# Patient Record
Sex: Female | Born: 1950
Health system: Southern US, Community
[De-identification: ages and names within clinical notes are randomized; demographics above are authoritative.]

## PROBLEM LIST (undated history)

## (undated) DIAGNOSIS — Z87442 Personal history of urinary calculi: Secondary | ICD-10-CM

## (undated) DIAGNOSIS — R7303 Prediabetes: Secondary | ICD-10-CM

## (undated) DIAGNOSIS — T8859XA Other complications of anesthesia, initial encounter: Secondary | ICD-10-CM

## (undated) DIAGNOSIS — D649 Anemia, unspecified: Secondary | ICD-10-CM

## (undated) DIAGNOSIS — I10 Essential (primary) hypertension: Secondary | ICD-10-CM

## (undated) DIAGNOSIS — C801 Malignant (primary) neoplasm, unspecified: Secondary | ICD-10-CM

## (undated) DIAGNOSIS — I779 Disorder of arteries and arterioles, unspecified: Secondary | ICD-10-CM

## (undated) DIAGNOSIS — I700 Atherosclerosis of aorta: Secondary | ICD-10-CM

## (undated) DIAGNOSIS — K219 Gastro-esophageal reflux disease without esophagitis: Secondary | ICD-10-CM

## (undated) HISTORY — DX: Atherosclerosis of aorta: I70.0

## (undated) HISTORY — PX: BUNIONECTOMY: SHX129

## (undated) HISTORY — DX: Disorder of arteries and arterioles, unspecified: I77.9

## (undated) HISTORY — PX: ABDOMINAL HYSTERECTOMY: SHX81

## (undated) HISTORY — DX: Malignant (primary) neoplasm, unspecified: C80.1

## (undated) HISTORY — PX: LITHOTRIPSY: SUR834

## (undated) HISTORY — PX: TUBAL LIGATION: SHX77

## (undated) HISTORY — DX: Essential (primary) hypertension: I10

## (undated) HISTORY — PX: LIPOMA EXCISION: SHX5283

---

## 2001-05-29 DIAGNOSIS — C801 Malignant (primary) neoplasm, unspecified: Secondary | ICD-10-CM

## 2001-05-29 HISTORY — DX: Malignant (primary) neoplasm, unspecified: C80.1

## 2001-05-29 HISTORY — PX: THYROIDECTOMY: SHX17

## 2007-07-31 ENCOUNTER — Encounter: Admission: RE | Admit: 2007-07-31 | Discharge: 2007-07-31 | Payer: Self-pay | Admitting: Family Medicine

## 2007-11-13 ENCOUNTER — Encounter: Admission: RE | Admit: 2007-11-13 | Discharge: 2007-11-13 | Payer: Self-pay | Admitting: Family Medicine

## 2008-04-08 ENCOUNTER — Encounter: Admission: RE | Admit: 2008-04-08 | Discharge: 2008-04-08 | Payer: Self-pay | Admitting: Internal Medicine

## 2008-10-28 ENCOUNTER — Other Ambulatory Visit: Admission: RE | Admit: 2008-10-28 | Discharge: 2008-10-28 | Payer: Self-pay | Admitting: Family Medicine

## 2009-06-02 ENCOUNTER — Encounter: Admission: RE | Admit: 2009-06-02 | Discharge: 2009-06-02 | Payer: Self-pay | Admitting: Internal Medicine

## 2009-07-19 ENCOUNTER — Encounter: Admission: RE | Admit: 2009-07-19 | Discharge: 2009-07-19 | Payer: Self-pay | Admitting: *Deleted

## 2010-05-02 ENCOUNTER — Encounter: Admission: RE | Admit: 2010-05-02 | Discharge: 2010-05-02 | Payer: Self-pay | Admitting: Internal Medicine

## 2012-06-24 ENCOUNTER — Other Ambulatory Visit: Payer: Self-pay | Admitting: Internal Medicine

## 2012-06-24 DIAGNOSIS — Z1231 Encounter for screening mammogram for malignant neoplasm of breast: Secondary | ICD-10-CM

## 2012-06-28 ENCOUNTER — Ambulatory Visit
Admission: RE | Admit: 2012-06-28 | Discharge: 2012-06-28 | Disposition: A | Discharge status: Home / Self Care | Payer: 59 | Source: Ambulatory Visit | Attending: Internal Medicine | Admitting: Internal Medicine

## 2012-06-28 DIAGNOSIS — Z1231 Encounter for screening mammogram for malignant neoplasm of breast: Secondary | ICD-10-CM

## 2013-08-21 ENCOUNTER — Other Ambulatory Visit: Payer: Self-pay

## 2013-08-21 DIAGNOSIS — Z1231 Encounter for screening mammogram for malignant neoplasm of breast: Secondary | ICD-10-CM

## 2013-09-01 ENCOUNTER — Ambulatory Visit
Admission: RE | Admit: 2013-09-01 | Discharge: 2013-09-01 | Disposition: A | Discharge status: Home / Self Care | Payer: 59 | Source: Ambulatory Visit

## 2013-09-01 DIAGNOSIS — Z1231 Encounter for screening mammogram for malignant neoplasm of breast: Secondary | ICD-10-CM

## 2014-04-07 ENCOUNTER — Other Ambulatory Visit (HOSPITAL_COMMUNITY)
Admission: RE | Admit: 2014-04-07 | Discharge: 2014-04-07 | Disposition: A | Discharge status: Home / Self Care | Payer: 59 | Source: Ambulatory Visit | Attending: Internal Medicine | Admitting: Internal Medicine

## 2014-04-07 DIAGNOSIS — Z01419 Encounter for gynecological examination (general) (routine) without abnormal findings: Secondary | ICD-10-CM | POA: Diagnosis not present

## 2014-10-16 ENCOUNTER — Other Ambulatory Visit: Payer: Self-pay

## 2014-10-16 DIAGNOSIS — Z1231 Encounter for screening mammogram for malignant neoplasm of breast: Secondary | ICD-10-CM

## 2014-11-13 ENCOUNTER — Ambulatory Visit: Payer: Self-pay

## 2014-12-10 ENCOUNTER — Ambulatory Visit
Admission: RE | Admit: 2014-12-10 | Discharge: 2014-12-10 | Disposition: A | Discharge status: Home / Self Care | Payer: 59 | Source: Ambulatory Visit

## 2014-12-10 DIAGNOSIS — Z1231 Encounter for screening mammogram for malignant neoplasm of breast: Secondary | ICD-10-CM

## 2016-01-10 ENCOUNTER — Other Ambulatory Visit: Payer: Self-pay | Admitting: Internal Medicine

## 2016-01-10 DIAGNOSIS — Z1231 Encounter for screening mammogram for malignant neoplasm of breast: Secondary | ICD-10-CM

## 2016-01-27 ENCOUNTER — Ambulatory Visit
Admission: RE | Admit: 2016-01-27 | Discharge: 2016-01-27 | Disposition: A | Discharge status: Home / Self Care | Payer: 59 | Source: Ambulatory Visit | Attending: Internal Medicine | Admitting: Internal Medicine

## 2016-01-27 ENCOUNTER — Encounter: Payer: Self-pay | Admitting: Radiology

## 2016-01-27 DIAGNOSIS — Z1231 Encounter for screening mammogram for malignant neoplasm of breast: Secondary | ICD-10-CM

## 2017-02-15 DIAGNOSIS — R7303 Prediabetes: Secondary | ICD-10-CM | POA: Diagnosis not present

## 2017-02-15 DIAGNOSIS — E890 Postprocedural hypothyroidism: Secondary | ICD-10-CM | POA: Diagnosis not present

## 2017-02-15 DIAGNOSIS — Z85850 Personal history of malignant neoplasm of thyroid: Secondary | ICD-10-CM | POA: Diagnosis not present

## 2017-02-20 DIAGNOSIS — E890 Postprocedural hypothyroidism: Secondary | ICD-10-CM | POA: Diagnosis not present

## 2017-02-20 DIAGNOSIS — Z85850 Personal history of malignant neoplasm of thyroid: Secondary | ICD-10-CM | POA: Diagnosis not present

## 2017-02-20 DIAGNOSIS — E611 Iron deficiency: Secondary | ICD-10-CM | POA: Diagnosis not present

## 2017-02-20 DIAGNOSIS — R7303 Prediabetes: Secondary | ICD-10-CM | POA: Diagnosis not present

## 2017-03-05 ENCOUNTER — Other Ambulatory Visit: Payer: Self-pay | Admitting: Internal Medicine

## 2017-03-05 DIAGNOSIS — Z1231 Encounter for screening mammogram for malignant neoplasm of breast: Secondary | ICD-10-CM

## 2017-03-16 ENCOUNTER — Ambulatory Visit: Payer: 59

## 2017-04-03 ENCOUNTER — Ambulatory Visit
Admission: RE | Admit: 2017-04-03 | Discharge: 2017-04-03 | Disposition: A | Discharge status: Home / Self Care | Payer: 59 | Source: Ambulatory Visit | Attending: Internal Medicine | Admitting: Internal Medicine

## 2017-04-03 DIAGNOSIS — Z1231 Encounter for screening mammogram for malignant neoplasm of breast: Secondary | ICD-10-CM

## 2017-05-04 ENCOUNTER — Other Ambulatory Visit (HOSPITAL_COMMUNITY): Payer: Self-pay | Admitting: Surgery

## 2017-05-28 ENCOUNTER — Encounter: Payer: 59 | Attending: Surgery | Admitting: Registered"

## 2017-05-28 ENCOUNTER — Encounter: Payer: Self-pay | Admitting: Registered"

## 2017-05-28 DIAGNOSIS — E890 Postprocedural hypothyroidism: Secondary | ICD-10-CM | POA: Diagnosis not present

## 2017-05-28 DIAGNOSIS — Z880 Allergy status to penicillin: Secondary | ICD-10-CM | POA: Insufficient documentation

## 2017-05-28 DIAGNOSIS — M549 Dorsalgia, unspecified: Secondary | ICD-10-CM | POA: Insufficient documentation

## 2017-05-28 DIAGNOSIS — Z713 Dietary counseling and surveillance: Secondary | ICD-10-CM | POA: Insufficient documentation

## 2017-05-28 DIAGNOSIS — Z8249 Family history of ischemic heart disease and other diseases of the circulatory system: Secondary | ICD-10-CM | POA: Diagnosis not present

## 2017-05-28 DIAGNOSIS — Z8261 Family history of arthritis: Secondary | ICD-10-CM | POA: Insufficient documentation

## 2017-05-28 DIAGNOSIS — Z6841 Body Mass Index (BMI) 40.0 and over, adult: Secondary | ICD-10-CM | POA: Insufficient documentation

## 2017-05-28 DIAGNOSIS — Z85850 Personal history of malignant neoplasm of thyroid: Secondary | ICD-10-CM | POA: Insufficient documentation

## 2017-05-28 DIAGNOSIS — Z882 Allergy status to sulfonamides status: Secondary | ICD-10-CM | POA: Diagnosis not present

## 2017-05-28 DIAGNOSIS — Z87442 Personal history of urinary calculi: Secondary | ICD-10-CM | POA: Diagnosis not present

## 2017-05-28 DIAGNOSIS — Z833 Family history of diabetes mellitus: Secondary | ICD-10-CM | POA: Diagnosis not present

## 2017-05-28 DIAGNOSIS — E669 Obesity, unspecified: Secondary | ICD-10-CM

## 2017-05-28 DIAGNOSIS — I10 Essential (primary) hypertension: Secondary | ICD-10-CM | POA: Insufficient documentation

## 2017-05-28 DIAGNOSIS — E7800 Pure hypercholesterolemia, unspecified: Secondary | ICD-10-CM | POA: Diagnosis not present

## 2017-05-28 NOTE — Progress Notes (Signed)
Pre-Op Assessment Visit:  Pre-Operative Sleeve gastrectomy Surgery  Medical Nutrition Therapy:  Appt start time: 2:00  End time: 2:50  Patient was seen on 05/28/2017 for Pre-Operative Nutrition Assessment. Assessment and letter of approval faxed to [facility] Bariatric Surgery Program coordinator on 05/28/2017.   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 Pt states she wants to know how to eat healthier. Pt states she is currently following Nutrisystem because its easy even though she does not like the food.   Per insurance, pt needs 6 SWL visits prior to surgery.    24 hr Dietary Recall: First Meal: boiled eggs; nutrisystem (english muffin, egg, sausage patty) Snack: sometimes nutrisystem snack bars (nutricurb) Second Meal: chicken noodle soup Snack: cheese stick, nuts Third Meal: sometimes skips; eggs or sushi Snack: none Beverages: water, unsweetened tea, coffee (2 cups daily)  Encouraged to engage in 150 minutes of moderate physical activity including cardiovascular and weight baring weekly  Handouts given during visit include:  . Pre-Op Goals . Bariatric Surgery Protein Shakes . Vitamin and Mineral Recommendations  During the appointment today the following Pre-Op Goals were reviewed with the patient: . Track your food and beverage: MyFitness Pal or Baritastic App . Make healthy food choices . Begin to limit portion sizes . Limited concentrated sugars and fried foods . Keep fat/sugar in the single digits per serving on          food labels . Practice CHEWING your food  (aim for 30 chews per bite or until applesauce consistency) . Practice not drinking 15  minutes before, during, and 30 minutes after each meal/snack . Avoid all carbonated beverages  . Avoid/limit caffeinated beverages  . Avoid all sugar-sweetened beverages . Avoid alcohol . Consume 3 meals per day; eat every 3-5 hours . Make a list of non-food related activities . Aim for 64-100 ounces of FLUID daily  . Aim for at least 60-80 grams of PROTEIN daily . Look for a liquid protein source that contain ?15 g protein and ?5 g carbohydrate  (ex: shakes, drinks, shots) . Physical activity is an important part of a healthy lifestyle so keep it moving!  Follow diet recommendations listed below Energy and Macronutrient Recommendations: Calories: 1600 Carbohydrate: 180 Protein: 120 Fat: 44  Demonstrated degree of understanding via:  Teach Back   Teaching Method Utilized:  Visual Auditory Hands on  Barriers to learning/adherence to lifestyle change: work-life balance  Patient to call the [facility] to enroll in Pre-Op and Post-Op Nutrition Education when surgery date is scheduled.

## 2017-06-01 DIAGNOSIS — H2513 Age-related nuclear cataract, bilateral: Secondary | ICD-10-CM | POA: Diagnosis not present

## 2017-06-01 DIAGNOSIS — H25013 Cortical age-related cataract, bilateral: Secondary | ICD-10-CM | POA: Diagnosis not present

## 2017-06-01 DIAGNOSIS — H3509 Other intraretinal microvascular abnormalities: Secondary | ICD-10-CM | POA: Diagnosis not present

## 2017-06-01 DIAGNOSIS — H04123 Dry eye syndrome of bilateral lacrimal glands: Secondary | ICD-10-CM | POA: Diagnosis not present

## 2017-06-01 DIAGNOSIS — H35033 Hypertensive retinopathy, bilateral: Secondary | ICD-10-CM | POA: Diagnosis not present

## 2017-06-25 ENCOUNTER — Ambulatory Visit: Payer: Self-pay | Admitting: Registered"

## 2017-07-02 ENCOUNTER — Encounter: Payer: Self-pay | Admitting: Registered"

## 2017-07-02 ENCOUNTER — Encounter: Payer: Medicare Other | Attending: Surgery | Admitting: Registered"

## 2017-07-02 DIAGNOSIS — E890 Postprocedural hypothyroidism: Secondary | ICD-10-CM | POA: Diagnosis not present

## 2017-07-02 DIAGNOSIS — M549 Dorsalgia, unspecified: Secondary | ICD-10-CM | POA: Diagnosis not present

## 2017-07-02 DIAGNOSIS — Z85850 Personal history of malignant neoplasm of thyroid: Secondary | ICD-10-CM | POA: Insufficient documentation

## 2017-07-02 DIAGNOSIS — Z8261 Family history of arthritis: Secondary | ICD-10-CM | POA: Insufficient documentation

## 2017-07-02 DIAGNOSIS — Z882 Allergy status to sulfonamides status: Secondary | ICD-10-CM | POA: Diagnosis not present

## 2017-07-02 DIAGNOSIS — Z8249 Family history of ischemic heart disease and other diseases of the circulatory system: Secondary | ICD-10-CM | POA: Diagnosis not present

## 2017-07-02 DIAGNOSIS — E7800 Pure hypercholesterolemia, unspecified: Secondary | ICD-10-CM | POA: Diagnosis not present

## 2017-07-02 DIAGNOSIS — Z880 Allergy status to penicillin: Secondary | ICD-10-CM | POA: Insufficient documentation

## 2017-07-02 DIAGNOSIS — Z87442 Personal history of urinary calculi: Secondary | ICD-10-CM | POA: Diagnosis not present

## 2017-07-02 DIAGNOSIS — Z713 Dietary counseling and surveillance: Secondary | ICD-10-CM | POA: Insufficient documentation

## 2017-07-02 DIAGNOSIS — I10 Essential (primary) hypertension: Secondary | ICD-10-CM | POA: Insufficient documentation

## 2017-07-02 DIAGNOSIS — Z833 Family history of diabetes mellitus: Secondary | ICD-10-CM | POA: Diagnosis not present

## 2017-07-02 DIAGNOSIS — Z6841 Body Mass Index (BMI) 40.0 and over, adult: Secondary | ICD-10-CM | POA: Diagnosis not present

## 2017-07-02 DIAGNOSIS — E669 Obesity, unspecified: Secondary | ICD-10-CM

## 2017-07-02 NOTE — Progress Notes (Signed)
Sleeve Gastrectomy Appt start time: 10:45 end time: 11:10  Assessment: 1st SWL Appointment.   Start Wt at [facility]: 240.6 Wt: 238.1 BMI: 48.09   Pt arrives having lost 2.5 lbs from previous visit. Pt states she has been following keto diet recently. Pt states is tracking using baritastic app and likes the accountability. Pt states she has reduced carbonation, taking MVI + Vtamin D + Fe, doing well with limiting concentrated sugars (eats sugar-free jello), and drinking at least 64 ounces of fluid a day. Pt states chewing is challenging but still working on it. Pt states she eats while working, driving, or watching television. Pt states her cat recently died. Pt is very motivated and making great changes. Pt states she wants to make this tool work for her.    MEDICATIONS: See list   DIETARY INTAKE:  24-hr recall:  B ( AM): eggs, cheese  Snk ( AM): sometimes cheese stick  L ( PM): protein, salad Snk ( PM): none D ( PM): protein, salad, cauliflower Snk ( PM): none Beverages: water, coffee  Usual physical activity: none stated  Diet to Follow: 1600 calories 180 g carbohydrates 120 g protein 44 g fat  Preferred Learning Style:   No preference indicated   Learning Readiness:   Ready  Change in progress     Nutritional Diagnosis:  [place]-3.3 Overweight/obesity related to past poor dietary habits and physical inactivity as evidenced by patient w/ planned sleeve gastrectomy surgery following dietary guidelines for continued weight loss.    Intervention:  Nutrition counseling for upcoming Bariatric Surgery.  Goals:  - Aim for 150 minutes of physical activity including cardio and weight bearing every week - Get back on track with physical activity.  - Continue to aim to chew well. - Work on mindful eating during meal times.   Teaching Method Utilized:  Visual Auditory  Handouts given during visit include:  none  Barriers to learning/adherence to lifestyle change: none  indentified  Demonstrated degree of understanding via:  Teach Back   Monitoring/Evaluation:  Dietary intake, exercise, and body weight in 1 month(s).

## 2017-07-02 NOTE — Patient Instructions (Addendum)
-   Get back on track with physical activity.   - Continue to aim to chew well.  - Work on mindful eating during meal times.

## 2017-07-24 ENCOUNTER — Ambulatory Visit: Payer: Self-pay | Admitting: Registered"

## 2017-07-31 ENCOUNTER — Encounter: Payer: Medicare Other | Attending: Surgery | Admitting: Registered"

## 2017-07-31 ENCOUNTER — Encounter: Payer: Self-pay | Admitting: Registered"

## 2017-07-31 DIAGNOSIS — E890 Postprocedural hypothyroidism: Secondary | ICD-10-CM | POA: Insufficient documentation

## 2017-07-31 DIAGNOSIS — I10 Essential (primary) hypertension: Secondary | ICD-10-CM | POA: Diagnosis not present

## 2017-07-31 DIAGNOSIS — Z713 Dietary counseling and surveillance: Secondary | ICD-10-CM | POA: Insufficient documentation

## 2017-07-31 DIAGNOSIS — Z87442 Personal history of urinary calculi: Secondary | ICD-10-CM | POA: Insufficient documentation

## 2017-07-31 DIAGNOSIS — E7800 Pure hypercholesterolemia, unspecified: Secondary | ICD-10-CM | POA: Insufficient documentation

## 2017-07-31 DIAGNOSIS — E669 Obesity, unspecified: Secondary | ICD-10-CM

## 2017-07-31 DIAGNOSIS — Z85850 Personal history of malignant neoplasm of thyroid: Secondary | ICD-10-CM | POA: Diagnosis not present

## 2017-07-31 DIAGNOSIS — Z8261 Family history of arthritis: Secondary | ICD-10-CM | POA: Diagnosis not present

## 2017-07-31 DIAGNOSIS — Z833 Family history of diabetes mellitus: Secondary | ICD-10-CM | POA: Diagnosis not present

## 2017-07-31 DIAGNOSIS — Z880 Allergy status to penicillin: Secondary | ICD-10-CM | POA: Diagnosis not present

## 2017-07-31 DIAGNOSIS — M549 Dorsalgia, unspecified: Secondary | ICD-10-CM | POA: Insufficient documentation

## 2017-07-31 DIAGNOSIS — Z6841 Body Mass Index (BMI) 40.0 and over, adult: Secondary | ICD-10-CM | POA: Diagnosis not present

## 2017-07-31 DIAGNOSIS — Z882 Allergy status to sulfonamides status: Secondary | ICD-10-CM | POA: Diagnosis not present

## 2017-07-31 DIAGNOSIS — Z8249 Family history of ischemic heart disease and other diseases of the circulatory system: Secondary | ICD-10-CM | POA: Insufficient documentation

## 2017-07-31 NOTE — Progress Notes (Signed)
Sleeve Gastrectomy Appt start time: 8:00 end time: 8:23  Assessment: 2nd SWL Appointment.   Start Wt at [facility]: 240.6 Wt: 238.1 BMI: 48.09   Pt arrives having maintained weight from previous visit. Pt states she has been following keto diet recently. Pt states she is also having surgery to reduce risk of getting diabetes. Pt states she has been more intentional and eats more meals at the table, less in her car and while working. Pt states she has been reducing intake of carbonated water. Pt states is still working on chewing well and forgets sometimes. Pt states her family is coming to visit from [place] in April.   Pt states she has reduced carbonation, taking MVI + Vtamin D + Fe, doing well with limiting concentrated sugars (eats sugar-free jello), and drinking at least 64 ounces of fluid a day. Pt is very motivated and making great changes. Pt states she wants to make this tool work for her.   Per insurance, pt needs 6 SWL visits prior to surgery.  MEDICATIONS: See list   DIETARY INTAKE:  24-hr recall:  B ( AM): eggs, cheese  Snk ( AM): sometimes cheese stick  L ( PM): protein, salad Snk ( PM): none D ( PM): protein, salad, cauliflower Snk ( PM): none Beverages: water, coffee  Usual physical activity: weekends mostly, 30 min 3 days/week on bike  Diet to Follow: 1600 calories 180 g carbohydrates 120 g protein 44 g fat  Preferred Learning Style:   No preference indicated   Learning Readiness:   Ready  Change in progress     Nutritional Diagnosis:  [place]-3.3 Overweight/obesity related to past poor dietary habits and physical inactivity as evidenced by patient w/ planned sleeve gastrectomy surgery following dietary guidelines for continued weight loss.    Intervention:  Nutrition counseling for upcoming Bariatric Surgery.  Goals:  - Find at least 2-3 flavors of protein shake/drink/powder that you enjoy with at least 15 grams or more of protein and 5 grams or less of  carbohydrate.  - Continue to work on chewing. - Increase physical activity to 4 times per week, by adding another day during the work week of biking or walking when weather permits.  - Keep up the great work!   Teaching Method Utilized:  Visual Auditory  Handouts given during visit include:  none  Barriers to learning/adherence to lifestyle change: none indentified  Demonstrated degree of understanding via:  Teach Back   Monitoring/Evaluation:  Dietary intake, exercise, and body weight in 1 month(s).

## 2017-07-31 NOTE — Patient Instructions (Addendum)
-   Find at least 2-3 flavors of protein shake/drink/powder that you enjoy with at least 15 grams or more of protein and 5 grams or less of carbohydrate.   - Continue to work on chewing.  - Increase physical activity to 4 times per week, by adding another day during the work week of biking or walking when weather permits.   - Keep up the great work!

## 2017-08-14 DIAGNOSIS — I10 Essential (primary) hypertension: Secondary | ICD-10-CM | POA: Diagnosis not present

## 2017-08-14 DIAGNOSIS — Z01419 Encounter for gynecological examination (general) (routine) without abnormal findings: Secondary | ICD-10-CM | POA: Diagnosis not present

## 2017-08-14 DIAGNOSIS — Z0000 Encounter for general adult medical examination without abnormal findings: Secondary | ICD-10-CM | POA: Diagnosis not present

## 2017-08-15 DIAGNOSIS — Z1151 Encounter for screening for human papillomavirus (HPV): Secondary | ICD-10-CM | POA: Diagnosis not present

## 2017-08-15 DIAGNOSIS — Z01419 Encounter for gynecological examination (general) (routine) without abnormal findings: Secondary | ICD-10-CM | POA: Diagnosis not present

## 2017-08-21 ENCOUNTER — Ambulatory Visit (INDEPENDENT_AMBULATORY_CARE_PROVIDER_SITE_OTHER): Payer: Medicare Other | Admitting: Psychiatry

## 2017-08-21 ENCOUNTER — Ambulatory Visit: Payer: Self-pay | Admitting: Registered"

## 2017-08-21 DIAGNOSIS — F509 Eating disorder, unspecified: Secondary | ICD-10-CM | POA: Diagnosis not present

## 2017-08-31 ENCOUNTER — Encounter: Payer: Self-pay | Admitting: Registered"

## 2017-08-31 ENCOUNTER — Encounter: Payer: Medicare Other | Attending: Surgery | Admitting: Registered"

## 2017-08-31 DIAGNOSIS — Z880 Allergy status to penicillin: Secondary | ICD-10-CM | POA: Insufficient documentation

## 2017-08-31 DIAGNOSIS — Z8249 Family history of ischemic heart disease and other diseases of the circulatory system: Secondary | ICD-10-CM | POA: Diagnosis not present

## 2017-08-31 DIAGNOSIS — Z8261 Family history of arthritis: Secondary | ICD-10-CM | POA: Insufficient documentation

## 2017-08-31 DIAGNOSIS — Z6841 Body Mass Index (BMI) 40.0 and over, adult: Secondary | ICD-10-CM | POA: Diagnosis not present

## 2017-08-31 DIAGNOSIS — E7800 Pure hypercholesterolemia, unspecified: Secondary | ICD-10-CM | POA: Insufficient documentation

## 2017-08-31 DIAGNOSIS — M549 Dorsalgia, unspecified: Secondary | ICD-10-CM | POA: Insufficient documentation

## 2017-08-31 DIAGNOSIS — I10 Essential (primary) hypertension: Secondary | ICD-10-CM | POA: Insufficient documentation

## 2017-08-31 DIAGNOSIS — E669 Obesity, unspecified: Secondary | ICD-10-CM

## 2017-08-31 DIAGNOSIS — Z87442 Personal history of urinary calculi: Secondary | ICD-10-CM | POA: Diagnosis not present

## 2017-08-31 DIAGNOSIS — Z882 Allergy status to sulfonamides status: Secondary | ICD-10-CM | POA: Diagnosis not present

## 2017-08-31 DIAGNOSIS — Z85850 Personal history of malignant neoplasm of thyroid: Secondary | ICD-10-CM | POA: Insufficient documentation

## 2017-08-31 DIAGNOSIS — Z833 Family history of diabetes mellitus: Secondary | ICD-10-CM | POA: Diagnosis not present

## 2017-08-31 DIAGNOSIS — E890 Postprocedural hypothyroidism: Secondary | ICD-10-CM | POA: Insufficient documentation

## 2017-08-31 DIAGNOSIS — Z713 Dietary counseling and surveillance: Secondary | ICD-10-CM | POA: Diagnosis not present

## 2017-08-31 NOTE — Patient Instructions (Addendum)
-   Increase physical activity to 4 times per week, by adding another day during the work week of biking or walking when weather permits.   - Find at least 1 more flavor of protein shake/drink/powder that you enjoy with at least 15 grams or more of protein and 5 grams or less of carbohydrate.   - Aim to stop and take breaks while working.

## 2017-08-31 NOTE — Progress Notes (Signed)
Sleeve Gastrectomy Appt start time: 8:00 end time: 9:20  Assessment: 3rd SWL Appointment.   Start Wt at [facility]: 240.6 Wt: 235.9 BMI: 47.65   Pt arrives having lost 2.2 lbs  from previous visit. Pt states she has been following keto diet. Pt states she likes Premier and Atkins chocolate flavors; vanilla is ok. Pt reports chewing pretty good especially when dining alone; more challenging when eating out with friends. Pt states she is preparing meals in advance and taking time to eat at the table. Pt states she feels like she has control. Pt states she has not been able to add another workout day into her routine. Pt states she sometimes does not take the time to stop working in order to eat lunch.   Pt states she is also having surgery to reduce risk of getting diabetes. Pt states she has been reducing intake of carbonated water. Pt states is still working on chewing well and forgets sometimes. Pt states her family is coming to visit from [place] in April.   Pt states she has reduced carbonation, taking MVI + Vtamin D + Fe, doing well with limiting concentrated sugars (eats sugar-free jello), and drinking at least 64 ounces of fluid a day. Pt is very motivated and making great changes. Pt states she wants to make this tool work for her.   Per insurance, pt needs 6 SWL visits prior to surgery.  MEDICATIONS: See list   DIETARY INTAKE:  24-hr recall:  B ( AM): eggs, cheese  Snk ( AM): sometimes cheese stick  L ( PM): protein, salad or mozzarella stick Snk ( PM): none D ( PM): protein, salad, cauliflower Snk ( PM): none Beverages: water, coffee  Usual physical activity: weekends mostly, 30 min 3 days/week on bike  Diet to Follow: 1600 calories 180 g carbohydrates 120 g protein 44 g fat  Preferred Learning Style:   No preference indicated   Learning Readiness:   Ready  Change in progress     Nutritional Diagnosis:  [place]-3.3 Overweight/obesity related to past poor dietary  habits and physical inactivity as evidenced by patient w/ planned sleeve gastrectomy surgery following dietary guidelines for continued weight loss.    Intervention:  Nutrition counseling for upcoming Bariatric Surgery.  Goals:  - Increase physical activity to 4 times per week, by adding another day during the work week of biking or walking when weather permits.  - Find at least 1 more flavor of protein shake/drink/powder that you enjoy with at least 15 grams or more of protein and 5 grams or less of carbohydrate.  - Aim to stop and take breaks while working.   Teaching Method Utilized:  Visual Auditory  Handouts given during visit include:  none  Barriers to learning/adherence to lifestyle change: none indentified  Demonstrated degree of understanding via:  Teach Back   Monitoring/Evaluation:  Dietary intake, exercise, and body weight in 1 month(s).

## 2017-09-18 ENCOUNTER — Ambulatory Visit: Payer: Self-pay | Admitting: Registered"

## 2017-09-28 ENCOUNTER — Encounter: Payer: Self-pay | Admitting: Registered"

## 2017-09-28 ENCOUNTER — Encounter: Payer: Medicare Other | Attending: Surgery | Admitting: Registered"

## 2017-09-28 DIAGNOSIS — M549 Dorsalgia, unspecified: Secondary | ICD-10-CM | POA: Diagnosis not present

## 2017-09-28 DIAGNOSIS — Z85850 Personal history of malignant neoplasm of thyroid: Secondary | ICD-10-CM | POA: Insufficient documentation

## 2017-09-28 DIAGNOSIS — E890 Postprocedural hypothyroidism: Secondary | ICD-10-CM | POA: Diagnosis not present

## 2017-09-28 DIAGNOSIS — I10 Essential (primary) hypertension: Secondary | ICD-10-CM | POA: Insufficient documentation

## 2017-09-28 DIAGNOSIS — E669 Obesity, unspecified: Secondary | ICD-10-CM

## 2017-09-28 DIAGNOSIS — E7800 Pure hypercholesterolemia, unspecified: Secondary | ICD-10-CM | POA: Insufficient documentation

## 2017-09-28 DIAGNOSIS — Z6841 Body Mass Index (BMI) 40.0 and over, adult: Secondary | ICD-10-CM | POA: Diagnosis not present

## 2017-09-28 DIAGNOSIS — Z880 Allergy status to penicillin: Secondary | ICD-10-CM | POA: Insufficient documentation

## 2017-09-28 DIAGNOSIS — Z833 Family history of diabetes mellitus: Secondary | ICD-10-CM | POA: Diagnosis not present

## 2017-09-28 DIAGNOSIS — Z8261 Family history of arthritis: Secondary | ICD-10-CM | POA: Diagnosis not present

## 2017-09-28 DIAGNOSIS — Z713 Dietary counseling and surveillance: Secondary | ICD-10-CM | POA: Diagnosis not present

## 2017-09-28 DIAGNOSIS — Z882 Allergy status to sulfonamides status: Secondary | ICD-10-CM | POA: Insufficient documentation

## 2017-09-28 DIAGNOSIS — Z8249 Family history of ischemic heart disease and other diseases of the circulatory system: Secondary | ICD-10-CM | POA: Insufficient documentation

## 2017-09-28 DIAGNOSIS — Z87442 Personal history of urinary calculi: Secondary | ICD-10-CM | POA: Insufficient documentation

## 2017-09-28 NOTE — Progress Notes (Signed)
Sleeve Gastrectomy Appt start time: 8:00 end time: 8:24  Assessment: 4th SWL Appointment.   Start Wt at [facility]: 240.6 Wt: 234.1 BMI: 47.28   Pt arrives having maintained weight from previous visit.  Pt states she likes Premier and Atkins chocolate flavors; vanilla, peaches and cream, and chocolate. Pt states she has been listening to her body. Pt states work has been really busy and stressful, thinking about getting another job. Pt states she has not been able to exercise within the past month.   Pt reports chewing pretty good especially when dining alone; more challenging when eating out with friends. Pt states she is preparing meals in advance and taking time to eat at the table. Pt states she feels like she has control. Pt states she sometimes does not take the time to stop working in order to eat lunch.   Pt states she is also having surgery to reduce risk of getting diabetes. Pt states she has been reducing intake of carbonated water. Pt states is still working on chewing well and forgets sometimes. Pt states her family is coming to visit from [place] in April.   Pt states she has reduced carbonation, taking MVI + Vtamin D + Fe, doing well with limiting concentrated sugars (eats sugar-free jello), and drinking at least 64 ounces of fluid a day. Pt is very motivated and making great changes. Pt states she wants to make this tool work for her.   Per insurance, pt needs 6 SWL visits prior to surgery.  MEDICATIONS: See list   DIETARY INTAKE:  24-hr recall:  B ( AM): eggs, cheese  Snk ( AM): sometimes cheese stick  L ( PM): protein shake Snk ( PM): none D ( PM): protein, salad, cauliflower Snk ( PM): none Beverages: water, coffee  Usual physical activity: none stated, too busy with work  Diet to Follow: 1600 calories 180 g carbohydrates 120 g protein 44 g fat  Preferred Learning Style:   No preference indicated   Learning Readiness:   Ready  Change in  progress     Nutritional Diagnosis:  [place]-3.3 Overweight/obesity related to past poor dietary habits and physical inactivity as evidenced by patient w/ planned sleeve gastrectomy surgery following dietary guidelines for continued weight loss.    Intervention:  Nutrition counseling for upcoming Bariatric Surgery.  Goals:  - Aim to take breaks during workday.  - Increase physical activity to 4 times per week, by adding another day during the work week of biking or walking when weather permits.  - Look into support group.  Teaching Method Utilized:  Visual Auditory  Handouts given during visit include:  none  Barriers to learning/adherence to lifestyle change: none indentified  Demonstrated degree of understanding via:  Teach Back   Monitoring/Evaluation:  Dietary intake, exercise, and body weight in 1 month(s).

## 2017-09-28 NOTE — Patient Instructions (Addendum)
-   Aim to take breaks during workday.   - Increase physical activity to 4 times per week, by adding another day during the work week of biking or walking when weather permits.   - Look into support group.

## 2017-10-12 DIAGNOSIS — I10 Essential (primary) hypertension: Secondary | ICD-10-CM | POA: Diagnosis not present

## 2017-10-12 DIAGNOSIS — E8881 Metabolic syndrome: Secondary | ICD-10-CM | POA: Diagnosis not present

## 2017-10-12 DIAGNOSIS — E039 Hypothyroidism, unspecified: Secondary | ICD-10-CM | POA: Diagnosis not present

## 2017-10-12 DIAGNOSIS — E7800 Pure hypercholesterolemia, unspecified: Secondary | ICD-10-CM | POA: Diagnosis not present

## 2017-10-16 ENCOUNTER — Ambulatory Visit: Payer: Self-pay | Admitting: Registered"

## 2017-11-01 ENCOUNTER — Encounter: Payer: Medicare Other | Attending: Surgery | Admitting: Registered"

## 2017-11-01 ENCOUNTER — Encounter: Payer: Self-pay | Admitting: Registered"

## 2017-11-01 DIAGNOSIS — Z87442 Personal history of urinary calculi: Secondary | ICD-10-CM | POA: Insufficient documentation

## 2017-11-01 DIAGNOSIS — M549 Dorsalgia, unspecified: Secondary | ICD-10-CM | POA: Insufficient documentation

## 2017-11-01 DIAGNOSIS — Z8249 Family history of ischemic heart disease and other diseases of the circulatory system: Secondary | ICD-10-CM | POA: Insufficient documentation

## 2017-11-01 DIAGNOSIS — E7800 Pure hypercholesterolemia, unspecified: Secondary | ICD-10-CM | POA: Diagnosis not present

## 2017-11-01 DIAGNOSIS — I10 Essential (primary) hypertension: Secondary | ICD-10-CM | POA: Diagnosis not present

## 2017-11-01 DIAGNOSIS — Z880 Allergy status to penicillin: Secondary | ICD-10-CM | POA: Diagnosis not present

## 2017-11-01 DIAGNOSIS — Z882 Allergy status to sulfonamides status: Secondary | ICD-10-CM | POA: Diagnosis not present

## 2017-11-01 DIAGNOSIS — E890 Postprocedural hypothyroidism: Secondary | ICD-10-CM | POA: Diagnosis not present

## 2017-11-01 DIAGNOSIS — Z85850 Personal history of malignant neoplasm of thyroid: Secondary | ICD-10-CM | POA: Insufficient documentation

## 2017-11-01 DIAGNOSIS — Z713 Dietary counseling and surveillance: Secondary | ICD-10-CM | POA: Insufficient documentation

## 2017-11-01 DIAGNOSIS — Z6841 Body Mass Index (BMI) 40.0 and over, adult: Secondary | ICD-10-CM | POA: Diagnosis not present

## 2017-11-01 DIAGNOSIS — Z8261 Family history of arthritis: Secondary | ICD-10-CM | POA: Diagnosis not present

## 2017-11-01 DIAGNOSIS — E669 Obesity, unspecified: Secondary | ICD-10-CM

## 2017-11-01 DIAGNOSIS — Z833 Family history of diabetes mellitus: Secondary | ICD-10-CM | POA: Diagnosis not present

## 2017-11-01 NOTE — Progress Notes (Signed)
Sleeve Gastrectomy Appt start time: 8:05 end time: 8:20  Assessment: 5th SWL Appointment.   Start Wt at [facility]: 240.6 Wt: 239.9 BMI: 48.45   Pt arrives having gained 5.8 lbs from previous visit. Pt states she is still stressed with work. Pt states she has not been stress eating. Pt states she has been not been drinking as much as she should. Pt states she will be in [place] for 10 days in July. Pt states she she is trying to stay at her job until Feb and plans to transition after that; wants to receive bonus first. Pt states she's been biking at home 30 min, 4 days/week.   Pt states she likes Premier and Atkins chocolate flavors; vanilla, peaches and cream, and chocolate. Pt states she has been listening to her body. Pt states work has been really busy and stressful, thinking about getting another job. Pt states she has not been able to exercise within the past month.   Pt reports chewing pretty good especially when dining alone; more challenging when eating out with friends. Pt states she is preparing meals in advance and taking time to eat at the table. Pt states she feels like she has control. Pt states she sometimes does not take the time to stop working in order to eat lunch.   Pt states she is also having surgery to reduce risk of getting diabetes. Pt states she has been reducing intake of carbonated water. Pt states is still working on chewing well and forgets sometimes. Pt states her family is coming to visit from [place] in April.   Pt states she has reduced carbonation, taking MVI + Vtamin D + Fe, doing well with limiting concentrated sugars (eats sugar-free jello), and drinking at least 64 ounces of fluid a day. Pt is very motivated and making great changes. Pt states she wants to make this tool work for her.   Per insurance, pt needs 6 SWL visits prior to surgery.  MEDICATIONS: See list   DIETARY INTAKE:  24-hr recall:  B ( AM): eggs, cheese  Snk ( AM): sometimes cheese stick   L ( PM): protein shake Snk ( PM): none D ( PM): protein, salad, cauliflower Snk ( PM): none Beverages: water, coffee  Usual physical activity: stationary biking 30 min, 4 days/week  Diet to Follow: 1600 calories 180 g carbohydrates 120 g protein 44 g fat  Preferred Learning Style:   No preference indicated   Learning Readiness:   Ready  Change in progress     Nutritional Diagnosis:  [place]-3.3 Overweight/obesity related to past poor dietary habits and physical inactivity as evidenced by patient w/ planned sleeve gastrectomy surgery following dietary guidelines for continued weight loss.    Intervention:  Nutrition counseling for upcoming Bariatric Surgery.  Goals:  - Aim for 150 minutes of physical activity including cardio and weight bearing every week - Look into support group. - Remember to sip throughout the day.  - Continue to keep up the great work with habits already established.   Teaching Method Utilized:  Visual Auditory  Handouts given during visit include:  none  Barriers to learning/adherence to lifestyle change: none indentified  Demonstrated degree of understanding via:  Teach Back   Monitoring/Evaluation:  Dietary intake, exercise, and body weight in 1 month(s).

## 2017-11-01 NOTE — Patient Instructions (Addendum)
-   Look into support group.  - Remember to sip throughout the day.   - Continue to keep up the great work with habits already established.

## 2017-11-13 ENCOUNTER — Ambulatory Visit: Payer: Self-pay | Admitting: Registered"

## 2017-11-20 DIAGNOSIS — D509 Iron deficiency anemia, unspecified: Secondary | ICD-10-CM | POA: Diagnosis not present

## 2017-11-27 ENCOUNTER — Encounter: Payer: Self-pay | Admitting: Registered"

## 2017-11-27 ENCOUNTER — Encounter: Payer: Medicare Other | Attending: Surgery | Admitting: Registered"

## 2017-11-27 DIAGNOSIS — E890 Postprocedural hypothyroidism: Secondary | ICD-10-CM | POA: Insufficient documentation

## 2017-11-27 DIAGNOSIS — E669 Obesity, unspecified: Secondary | ICD-10-CM

## 2017-11-27 DIAGNOSIS — I10 Essential (primary) hypertension: Secondary | ICD-10-CM | POA: Diagnosis not present

## 2017-11-27 DIAGNOSIS — F419 Anxiety disorder, unspecified: Secondary | ICD-10-CM | POA: Diagnosis not present

## 2017-11-27 DIAGNOSIS — Z8249 Family history of ischemic heart disease and other diseases of the circulatory system: Secondary | ICD-10-CM | POA: Diagnosis not present

## 2017-11-27 DIAGNOSIS — Z833 Family history of diabetes mellitus: Secondary | ICD-10-CM | POA: Diagnosis not present

## 2017-11-27 DIAGNOSIS — Z85850 Personal history of malignant neoplasm of thyroid: Secondary | ICD-10-CM | POA: Insufficient documentation

## 2017-11-27 DIAGNOSIS — Z713 Dietary counseling and surveillance: Secondary | ICD-10-CM | POA: Insufficient documentation

## 2017-11-27 DIAGNOSIS — E7800 Pure hypercholesterolemia, unspecified: Secondary | ICD-10-CM | POA: Insufficient documentation

## 2017-11-27 DIAGNOSIS — Z880 Allergy status to penicillin: Secondary | ICD-10-CM | POA: Diagnosis not present

## 2017-11-27 DIAGNOSIS — Z87442 Personal history of urinary calculi: Secondary | ICD-10-CM | POA: Diagnosis not present

## 2017-11-27 DIAGNOSIS — Z8261 Family history of arthritis: Secondary | ICD-10-CM | POA: Insufficient documentation

## 2017-11-27 DIAGNOSIS — M549 Dorsalgia, unspecified: Secondary | ICD-10-CM | POA: Diagnosis not present

## 2017-11-27 DIAGNOSIS — Z6841 Body Mass Index (BMI) 40.0 and over, adult: Secondary | ICD-10-CM | POA: Diagnosis not present

## 2017-11-27 DIAGNOSIS — Z882 Allergy status to sulfonamides status: Secondary | ICD-10-CM | POA: Diagnosis not present

## 2017-11-27 NOTE — Patient Instructions (Signed)
Keep up the great work!

## 2017-11-27 NOTE — Progress Notes (Signed)
Sleeve Gastrectomy Appt start time: 9:14 end time: 9:34  Assessment: 6th SWL Appointment.   Start Wt at [facility]: 240.6 Wt: 239.2 BMI: 48.31   Pt arrives having maintained weight from previous visit. Pt states she went to support group in June and loved it. Pt state she is excited about having surgery and excited about attending [facility]. Pt states she has been doing well sipping fluids throughout her day. Pt states she has added carbohydrates back into her daily regimen because she realized she was not losing weight. Pt states she weighs daily and 240 lbs is her limit. Pt states when she sees a higher number, she will monitor her intake closer and limit herself. Pt was encouraged to focus less on weight and more on habits and giving her body what it needs.   Pt states she is still stressed with work. Pt states she will be in [place] for 10 days in July. Pt states she she is trying to stay at her job until Feb and plans to transition after that; wants to receive bonus first. Pt states she's been biking at home 30 min, at least 3 days/week.   Pt states she likes Premier and Atkins chocolate flavors; vanilla, peaches and cream, and chocolate. Pt states she has been listening to her body. Pt states work has been really busy and stressful, thinking about getting another job. Pt states she has not been able to exercise within the past month.   Pt reports chewing pretty good especially when dining alone; more challenging when eating out with friends. Pt states she is preparing meals in advance and taking time to eat at the table. Pt states she feels like she has control. Pt states she sometimes does not take the time to stop working in order to eat lunch.   Pt states she is also having surgery to reduce risk of getting diabetes. Pt states she has been reducing intake of carbonated water. Pt states is still working on chewing well and forgets sometimes. Pt states her family is coming to visit from [place] in  April.   Pt states she has reduced carbonation, taking MVI + Vtamin D + Fe, doing well with limiting concentrated sugars (eats sugar-free jello), and drinking at least 64 ounces of fluid a day. Pt is very motivated and making great changes. Pt states she wants to make this tool work for her.   Per insurance, pt needs 6 SWL visits prior to surgery.  MEDICATIONS: See list   DIETARY INTAKE:  24-hr recall:  B ( AM): eggs, toast Snk ( AM): sometimes cheese stick  L ( PM): protein shake Snk ( PM): berries  D ( PM): protein, salad, cauliflower Snk ( PM): none Beverages: water, coffee  Usual physical activity: stationary biking 30 min, at least 3 days/week  Diet to Follow: 1600 calories 180 g carbohydrates 120 g protein 44 g fat  Preferred Learning Style:   No preference indicated   Learning Readiness:   Ready  Change in progress     Nutritional Diagnosis:  [place]-3.3 Overweight/obesity related to past poor dietary habits and physical inactivity as evidenced by patient w/ planned sleeve gastrectomy surgery following dietary guidelines for continued weight loss.    Intervention:  Nutrition counseling for upcoming Bariatric Surgery. Pt was educated and counseled on the next steps, expected nutrition appointments after surgery, and encouraged to not focus on weight but habits and health. Pt was in agreement with goals listed.  Goals:  - Aim  for 150 minutes of physical activity including cardio and weight bearing every week - Keep up the great work!    Teaching Method Utilized:  Visual Auditory  Handouts given during visit include:  none  Barriers to learning/adherence to lifestyle change: none indentified  Demonstrated degree of understanding via:  Teach Back   Monitoring/Evaluation:  Dietary intake, exercise, and body weight prn.

## 2017-11-30 ENCOUNTER — Ambulatory Visit: Payer: Self-pay | Admitting: Registered"

## 2017-12-18 ENCOUNTER — Ambulatory Visit (INDEPENDENT_AMBULATORY_CARE_PROVIDER_SITE_OTHER): Payer: Medicare Other | Admitting: Psychiatry

## 2017-12-18 DIAGNOSIS — F509 Eating disorder, unspecified: Secondary | ICD-10-CM | POA: Diagnosis not present

## 2017-12-27 ENCOUNTER — Other Ambulatory Visit: Payer: Self-pay

## 2017-12-27 ENCOUNTER — Ambulatory Visit (HOSPITAL_COMMUNITY)
Admission: RE | Admit: 2017-12-27 | Discharge: 2017-12-27 | Disposition: A | Discharge status: Home / Self Care | Payer: Medicare Other | Source: Ambulatory Visit | Attending: Surgery | Admitting: Surgery

## 2017-12-27 DIAGNOSIS — K224 Dyskinesia of esophagus: Secondary | ICD-10-CM | POA: Insufficient documentation

## 2017-12-27 DIAGNOSIS — K449 Diaphragmatic hernia without obstruction or gangrene: Secondary | ICD-10-CM | POA: Insufficient documentation

## 2017-12-31 ENCOUNTER — Encounter: Payer: Medicare Other | Attending: Surgery | Admitting: Skilled Nursing Facility1

## 2017-12-31 DIAGNOSIS — I10 Essential (primary) hypertension: Secondary | ICD-10-CM | POA: Diagnosis not present

## 2017-12-31 DIAGNOSIS — Z713 Dietary counseling and surveillance: Secondary | ICD-10-CM | POA: Diagnosis not present

## 2017-12-31 DIAGNOSIS — Z87442 Personal history of urinary calculi: Secondary | ICD-10-CM | POA: Diagnosis not present

## 2017-12-31 DIAGNOSIS — E7800 Pure hypercholesterolemia, unspecified: Secondary | ICD-10-CM | POA: Insufficient documentation

## 2017-12-31 DIAGNOSIS — Z8249 Family history of ischemic heart disease and other diseases of the circulatory system: Secondary | ICD-10-CM | POA: Insufficient documentation

## 2017-12-31 DIAGNOSIS — E669 Obesity, unspecified: Secondary | ICD-10-CM

## 2017-12-31 DIAGNOSIS — Z882 Allergy status to sulfonamides status: Secondary | ICD-10-CM | POA: Diagnosis not present

## 2017-12-31 DIAGNOSIS — Z880 Allergy status to penicillin: Secondary | ICD-10-CM | POA: Diagnosis not present

## 2017-12-31 DIAGNOSIS — E890 Postprocedural hypothyroidism: Secondary | ICD-10-CM | POA: Diagnosis not present

## 2017-12-31 DIAGNOSIS — M549 Dorsalgia, unspecified: Secondary | ICD-10-CM | POA: Insufficient documentation

## 2017-12-31 DIAGNOSIS — Z833 Family history of diabetes mellitus: Secondary | ICD-10-CM | POA: Insufficient documentation

## 2017-12-31 DIAGNOSIS — Z6841 Body Mass Index (BMI) 40.0 and over, adult: Secondary | ICD-10-CM | POA: Diagnosis not present

## 2017-12-31 DIAGNOSIS — Z8261 Family history of arthritis: Secondary | ICD-10-CM | POA: Diagnosis not present

## 2017-12-31 DIAGNOSIS — Z85850 Personal history of malignant neoplasm of thyroid: Secondary | ICD-10-CM | POA: Diagnosis not present

## 2018-01-01 ENCOUNTER — Encounter: Payer: Self-pay | Admitting: Skilled Nursing Facility1

## 2018-01-01 NOTE — Progress Notes (Signed)
Pre-Operative Nutrition Class:  Appt start time: 2919   End time:  1830.  Patient was seen on 12/31/2017 for Pre-Operative Bariatric Surgery [facility].   Surgery date:  Surgery type: sleeve Start weight at [facility]: 240.6 Weight today: 246  Samples given per MNT protocol. Patient educated on appropriate usage: Bariatric Advantage Calcium  Lot #[id number] Exp:may/05/2018  Procare Multi: Lot: [id number] Exp: 02/2019  Renee Pain Protein Shake Lot #[id number] Exp:August 03, 2018   The following the learning objectives were met by the patient during this course:  Identify Pre-Op Dietary Goals and will begin 2 weeks pre-operatively  Identify appropriate sources of fluids and proteins   State protein recommendations and appropriate sources pre and post-operatively  Identify Post-Operative Dietary Goals and will follow for 2 weeks post-operatively  Identify appropriate multivitamin and calcium sources  Describe the need for physical activity post-operatively and will follow MD recommendations  State when to call healthcare provider regarding medication questions or post-operative complications  Handouts given during class include:  Pre-Op Bariatric Surgery Diet Handout  Protein Shake Handout  Post-Op Bariatric Surgery Nutrition Handout  [facility] Program Information Flyer  Support Group Information Flyer  [facility] Bariatric Supplements Price List  Follow[facility] Plan: [facility] diet advancement per MD.

## 2018-01-14 NOTE — Patient Instructions (Signed)
Carol Sanchez  01/14/2018   Your procedure is scheduled on: Tuesday 01/22/2018  Report to [facility] Main  Entrance              Report to admitting at  [place]  AM    Call this number if you have problems the morning of surgery [phone]    Remember: Do not eat food  :After 6 pm.    MORNING OF SURGERY DRINK:  House, DRINK ALL OF THE SHAKE AT ONE TIME.    NO SOLID FOOD AFTER 600 PM THE NIGHT BEFORE YOUR SURGERY. YOU MAY DRINK CLEAR FLUIDS. THE SHAKE YOU DRINK BEFORE YOU LEAVE HOME WILL BE THE LAST FLUIDS YOU DRINK BEFORE SURGERY.  PAIN IS EXPECTED AFTER SURGERY AND WILL NOT BE COMPLETELY ELIMINATED. AMBULATION AND TYLENOL WILL HELP REDUCE INCISIONAL AND GAS PAIN. MOVEMENT IS KEY!  YOU ARE EXPECTED TO BE OUT OF BED WITHIN 4 HOURS OF ADMISSION TO YOUR PATIENT ROOM.  SITTING IN THE RECLINER THROUGHOUT THE DAY IS IMPORTANT FOR DRINKING FLUIDS AND MOVING GAS THROUGHOUT THE GI TRACT.  COMPRESSION STOCKINGS SHOULD BE WORN [place] UNLESS YOU ARE WALKING.   INCENTIVE SPIROMETER SHOULD BE USED EVERY HOUR WHILE AWAKE TO DECREASE POST-OPERATIVE COMPLICATIONS SUCH AS PNEUMONIA.  WHEN DISCHARGED HOME, IT IS IMPORTANT TO CONTINUE TO WALK EVERY HOUR AND USE THE INCENTIVE SPIROMETER EVERY HOUR.       CLEAR LIQUID DIET   Foods Allowed                                                                     Foods Excluded  Coffee and tea, regular and decaf                             liquids that you cannot  Plain Jell-O in any flavor                                             see through such as: Fruit ices (not with fruit pulp)                                     milk, soups, orange juice  Iced Popsicles                                    All solid food Carbonated beverages, regular and diet                                    Cranberry, grape and apple juices Sports drinks like Gatorade Lightly seasoned clear broth or  consume(fat free) Sugar, honey syrup  Sample Menu Breakfast  Lunch                                     Supper Cranberry juice                    Beef broth                            Chicken broth Jell-O                                     Grape juice                           Apple juice Coffee or tea                        Jell-O                                      Popsicle                                                Coffee or tea                        Coffee or tea  _____________________________________________________________________         Take these medicines the morning of surgery with A SIP OF WATER: Levothyroxine (Synthroid)                                 You may not have any metal on your body including hair pins and              piercings  Do not wear jewelry, make-up, lotions, powders or perfumes, deodorant             Do not wear nail polish.  Do not shave  48 hours prior to surgery.            Do not bring valuables to the hospital. [place].  Contacts, dentures or bridgework may not be worn into surgery.  Leave suitcase in the car. After surgery it may be brought to your room.                  Please read over the following fact sheets you were given: _____________________________________________________________________             [facility] - Preparing for Surgery Before surgery, you can play an important role.  Because skin is not sterile, your skin needs to be as free of germs as possible.  You can reduce the number of germs on your skin by washing with CHG (chlorahexidine gluconate) soap before surgery.  CHG is an antiseptic cleaner which kills germs and bonds with the skin to continue killing germs even after washing. Please DO NOT use  if you have an allergy to CHG or antibacterial soaps.  If your skin becomes reddened/irritated stop using the CHG and inform your nurse  when you arrive at Short Stay. Do not shave (including legs and underarms) for at least 48 hours prior to the first CHG shower.  You may shave your face/neck. Please follow these instructions carefully:  1.  Shower with CHG Soap the night before surgery and the  morning of Surgery.  2.  If you choose to wash your hair, wash your hair first as usual with your  normal  shampoo.  3.  After you shampoo, rinse your hair and body thoroughly to remove the  shampoo.                           4.  Use CHG as you would any other liquid soap.  You can apply chg directly  to the skin and wash                       Gently with a scrungie or clean washcloth.  5.  Apply the CHG Soap to your body ONLY FROM THE NECK DOWN.   Do not use on face/ open                           Wound or open sores. Avoid contact with eyes, ears mouth and genitals (private parts).                       Wash face,  Genitals (private parts) with your normal soap.             6.  Wash thoroughly, paying special attention to the area where your surgery  will be performed.  7.  Thoroughly rinse your body with warm water from the neck down.  8.  DO NOT shower/wash with your normal soap after using and rinsing off  the CHG Soap.                9.  Pat yourself dry with a clean towel.            10.  Wear clean pajamas.            11.  Place clean sheets on your bed the night of your first shower and do not  sleep with pets. Day of Surgery : Do not apply any lotions/deodorants the morning of surgery.  Please wear clean clothes to the hospital/surgery center.  FAILURE TO FOLLOW THESE INSTRUCTIONS MAY RESULT IN THE CANCELLATION OF YOUR SURGERY PATIENT SIGNATURE_________________________________  NURSE SIGNATURE__________________________________  ________________________________________________________________________   Adam Phenix  An incentive spirometer is a tool that can help keep your lungs clear and active. This tool  measures how well you are filling your lungs with each breath. Taking long deep breaths may help reverse or decrease the chance of developing breathing (pulmonary) problems (especially infection) following:  A long period of time when you are unable to move or be active. BEFORE THE PROCEDURE   If the spirometer includes an indicator to show your best effort, your nurse or respiratory therapist will set it to a desired goal.  If possible, sit up straight or lean slightly forward. Try not to slouch.  Hold the incentive spirometer in an upright position. INSTRUCTIONS FOR USE  1. Sit on the edge of your bed if possible, or  sit up as far as you can in bed or on a chair. 2. Hold the incentive spirometer in an upright position. 3. Breathe out normally. 4. Place the mouthpiece in your mouth and seal your lips tightly around it. 5. Breathe in slowly and as deeply as possible, raising the piston or the ball toward the top of the column. 6. Hold your breath for 3-5 seconds or for as long as possible. Allow the piston or ball to fall to the bottom of the column. 7. Remove the mouthpiece from your mouth and breathe out normally. 8. Rest for a few seconds and repeat Steps 1 through 7 at least 10 times every 1-2 hours when you are awake. Take your time and take a few normal breaths between deep breaths. 9. The spirometer may include an indicator to show your best effort. Use the indicator as a goal to work toward during each repetition. 10. After each set of 10 deep breaths, practice coughing to be sure your lungs are clear. If you have an incision (the cut made at the time of surgery), support your incision when coughing by placing a pillow or rolled up towels firmly against it. Once you are able to get out of bed, walk around indoors and cough well. You may stop using the incentive spirometer when instructed by your caregiver.  RISKS AND COMPLICATIONS  Take your time so you do not get dizzy or  light-headed.  If you are in pain, you may need to take or ask for pain medication before doing incentive spirometry. It is harder to take a deep breath if you are having pain. AFTER USE  Rest and breathe slowly and easily.  It can be helpful to keep track of a log of your progress. Your caregiver can provide you with a simple table to help with this. If you are using the spirometer at home, follow these instructions: [place] IF:   You are having difficultly using the spirometer.  You have trouble using the spirometer as often as instructed.  Your pain medication is not giving enough relief while using the spirometer.  You develop fever of 100.5 F (38.1 C) or higher. SEEK IMMEDIATE MEDICAL CARE IF:   You cough up bloody sputum that had not been present before.  You develop fever of 102 F (38.9 C) or greater.  You develop worsening pain at or near the incision site. MAKE SURE YOU:   Understand these instructions.  Will watch your condition.  Will get help right away if you are not doing well or get worse. Document Released: 09/25/2006 Document Revised: 08/07/2011 Document Reviewed: 11/26/2006 [facility] Patient Information 2014 [place], [place].   ________________________________________________________________________

## 2018-01-15 ENCOUNTER — Other Ambulatory Visit: Payer: Self-pay

## 2018-01-15 ENCOUNTER — Encounter (HOSPITAL_COMMUNITY)
Admission: RE | Admit: 2018-01-15 | Discharge: 2018-01-15 | Disposition: A | Discharge status: Home / Self Care | Payer: Medicare Other | Source: Ambulatory Visit | Attending: Surgery | Admitting: Surgery

## 2018-01-15 ENCOUNTER — Encounter (HOSPITAL_COMMUNITY): Payer: Self-pay

## 2018-01-15 DIAGNOSIS — Z6841 Body Mass Index (BMI) 40.0 and over, adult: Secondary | ICD-10-CM | POA: Insufficient documentation

## 2018-01-15 DIAGNOSIS — Z0183 Encounter for blood typing: Secondary | ICD-10-CM | POA: Insufficient documentation

## 2018-01-15 DIAGNOSIS — Z01812 Encounter for preprocedural laboratory examination: Secondary | ICD-10-CM | POA: Insufficient documentation

## 2018-01-15 DIAGNOSIS — K449 Diaphragmatic hernia without obstruction or gangrene: Secondary | ICD-10-CM | POA: Diagnosis not present

## 2018-01-15 HISTORY — DX: Prediabetes: R73.03

## 2018-01-15 LAB — CBC WITH DIFFERENTIAL/PLATELET
Basophils Absolute: 0 10*3/uL (ref 0.0–0.1)
Basophils Relative: 1 %
EOS ABS: 0.4 10*3/uL (ref 0.0–0.7)
Eosinophils Relative: 4 %
HEMATOCRIT: 43.5 % (ref 36.0–46.0)
HEMOGLOBIN: 14.3 g/dL (ref 12.0–15.0)
LYMPHS ABS: 2.2 10*3/uL (ref 0.7–4.0)
Lymphocytes Relative: 25 %
MCH: 28.9 pg (ref 26.0–34.0)
MCHC: 32.9 g/dL (ref 30.0–36.0)
MCV: 87.9 fL (ref 78.0–100.0)
MONOS PCT: 7 %
Monocytes Absolute: 0.7 10*3/uL (ref 0.1–1.0)
NEUTROS PCT: 63 %
Neutro Abs: 5.6 10*3/uL (ref 1.7–7.7)
Platelets: 274 10*3/uL (ref 150–400)
RBC: 4.95 MIL/uL (ref 3.87–5.11)
RDW: 15 % (ref 11.5–15.5)
WBC: 8.8 10*3/uL (ref 4.0–10.5)

## 2018-01-15 LAB — COMPREHENSIVE METABOLIC PANEL
ALBUMIN: 3.9 g/dL (ref 3.5–5.0)
ALK PHOS: 67 U/L (ref 38–126)
ALT: 22 U/L (ref 0–44)
AST: 19 U/L (ref 15–41)
Anion gap: 10 (ref 5–15)
BILIRUBIN TOTAL: 0.7 mg/dL (ref 0.3–1.2)
BUN: 26 mg/dL — ABNORMAL HIGH (ref 8–23)
CALCIUM: 9.4 mg/dL (ref 8.9–10.3)
CO2: 30 mmol/L (ref 22–32)
CREATININE: 0.76 mg/dL (ref 0.44–1.00)
Chloride: 103 mmol/L (ref 98–111)
GFR calc non Af Amer: >60 mL/min (ref >60)
GLUCOSE: 99 mg/dL (ref 70–99)
Potassium: 4.4 mmol/L (ref 3.5–5.1)
SODIUM: 143 mmol/L (ref 135–145)
TOTAL PROTEIN: 6.9 g/dL (ref 6.5–8.1)

## 2018-01-15 LAB — HEMOGLOBIN A1C
HEMOGLOBIN A1C: 5.9 % — AB (ref 4.8–5.6)
Mean Plasma Glucose: 122.63 mg/dL

## 2018-01-16 ENCOUNTER — Other Ambulatory Visit (HOSPITAL_COMMUNITY): Payer: Self-pay | Admitting: Surgery

## 2018-01-16 DIAGNOSIS — Z6841 Body Mass Index (BMI) 40.0 and over, adult: Secondary | ICD-10-CM

## 2018-01-16 DIAGNOSIS — K449 Diaphragmatic hernia without obstruction or gangrene: Secondary | ICD-10-CM | POA: Diagnosis not present

## 2018-01-16 DIAGNOSIS — E8881 Metabolic syndrome: Secondary | ICD-10-CM | POA: Diagnosis not present

## 2018-01-16 LAB — TYPE AND SCREEN
ABO/RH(D): A POS
Antibody Screen: POSITIVE
PT AG TYPE: NEGATIVE

## 2018-01-16 NOTE — H&P (Signed)
Chief Complaint: morbid obesity and hiatal hernia  History of Present Illness:  Carol Sanchez is an 67 y.o. female who has been evaluated for bariatric surgery.  Initially we were planning a sleeve gastrectomy but her UGI revealed a very large hiatal hernia.  We will proceed with lap repair and roux en Y gastric bypass.    Past Medical History:  Diagnosis Date  . Cancer [facility]) 2003   thyroid  . Hypertension   . Pre-diabetes    Metobolic Syndrome    Past Surgical History:  Procedure Laterality Date  . BUNIONECTOMY     left foot  . LIPOMA EXCISION    . THYROIDECTOMY  2003  . TUBAL LIGATION      No current facility-administered medications for this encounter.    Current Outpatient Medications  Medication Sig Dispense Refill  . acetaminophen (TYLENOL) 500 MG tablet Take 1,000 mg by mouth 2 (two) times daily as needed for headache.    . Calcium Carbonate-Vitamin D (CALCIUM-D PO) Take 1 tablet by mouth 2 (two) times daily.    . diclofenac sodium (VOLTAREN) 1 % GEL Apply 1 application topically daily as needed (pain).    Marland Kitchen EPINEPHrine (EPIPEN JR) 0.15 MG/0.3ML injection Inject 0.15 mg into the muscle as needed for anaphylaxis.   1  . hydrochlorothiazide (HYDRODIURIL) 25 MG tablet Take 25 mg by mouth daily.    Marland Kitchen levothyroxine (SYNTHROID, LEVOTHROID) 125 MCG tablet Take 125 mcg by mouth every other day. Alternating between 125 mcg and 137 mcg    . levothyroxine (SYNTHROID, LEVOTHROID) 137 MCG tablet Take 137 mcg by mouth every other day. Alternating between 125 mcg and 137 mcg    . losartan (COZAAR) 50 MG tablet Take 50 mg by mouth at bedtime.     . Multiple Vitamin (MULTIVITAMIN WITH MINERALS) TABS tablet Take 1 tablet by mouth daily.    Marland Kitchen zolpidem (AMBIEN) 10 MG tablet Take 2.5 mg by mouth at bedtime as needed for sleep.  1   Shellfish allergy; Penicillins; and Sulfa antibiotics Family History  Problem Relation Age of Onset  . Hypertension Other   . Heart disease Other     Social History:   reports that she quit smoking about 42 years ago. Her smoking use included cigarettes. She has never used smokeless tobacco. She reports that she drinks alcohol. She reports that she does not use drugs.   REVIEW OF SYSTEMS : Negative except for see problem list.  Hiatal hernia is probably the cause of her hx of anemia  Physical Exam:   There were no vitals taken for this visit. There is no height or weight on file to calculate BMI.  Gen:  WDWN WF NAD  Neurological: Alert and oriented to person, place, and time. Motor and sensory function is grossly intact  Head: Normocephalic and atraumatic.  Eyes: Conjunctivae are normal. Pupils are equal, round, and reactive to light. No scleral icterus.  Neck: Normal range of motion. Neck supple. No tracheal deviation or thyromegaly present.  Cardiovascular:  SR without murmurs or gallops.  No carotid bruits Breast:  Not examined Respiratory: Effort normal.  No respiratory distress. No chest wall tenderness. Breath sounds normal.  No wheezes, rales or rhonchi.  Abdomen:  nontender GU:  Not examined Musculoskeletal: Normal range of motion. Extremities are nontender. No cyanosis, edema or clubbing noted Lymphadenopathy: No cervical, preauricular, postauricular or axillary adenopathy is present Skin: Skin is warm and dry. No rash noted. No diaphoresis. No erythema. No pallor. Pscyh:  Normal mood and affect. Behavior is normal. Judgment and thought content normal.   LABORATORY RESULTS: Results for orders placed or performed during the hospital encounter of 01/15/18 (from the past 48 hour(s))  CBC WITH DIFFERENTIAL     Status: None   Collection Time: 01/15/18  9:13 AM  Result Value Ref Range   WBC 8.8 4.0 - 10.5 K/uL   RBC 4.95 3.87 - 5.11 MIL/uL   Hemoglobin 14.3 12.0 - 15.0 g/dL   HCT 43.5 36.0 - 46.0 %   MCV 87.9 78.0 - 100.0 fL   MCH 28.9 26.0 - 34.0 pg   MCHC 32.9 30.0 - 36.0 g/dL   RDW 15.0 11.5 - 15.5 %   Platelets 274  150 - 400 K/uL   Neutrophils Relative % 63 %   Neutro Abs 5.6 1.7 - 7.7 K/uL   Lymphocytes Relative 25 %   Lymphs Abs 2.2 0.7 - 4.0 K/uL   Monocytes Relative 7 %   Monocytes Absolute 0.7 0.1 - 1.0 K/uL   Eosinophils Relative 4 %   Eosinophils Absolute 0.4 0.0 - 0.7 K/uL   Basophils Relative 1 %   Basophils Absolute 0.0 0.0 - 0.1 K/uL    Comment: Performed at [facility], [place] [address] [place], [place] [address]  Comprehensive metabolic panel     Status: Abnormal   Collection Time: 01/15/18  9:13 AM  Result Value Ref Range   Sodium 143 135 - 145 mmol/L   Potassium 4.4 3.5 - 5.1 mmol/L   Chloride 103 98 - 111 mmol/L   CO2 30 22 - 32 mmol/L   Glucose, Bld 99 70 - 99 mg/dL   BUN 26 (H) 8 - 23 mg/dL   Creatinine, Ser 0.76 0.44 - 1.00 mg/dL   Calcium 9.4 8.9 - 10.3 mg/dL   Total Protein 6.9 6.5 - 8.1 g/dL   Albumin 3.9 3.5 - 5.0 g/dL   AST 19 15 - 41 U/L   ALT 22 0 - 44 U/L   Alkaline Phosphatase 67 38 - 126 U/L   Total Bilirubin 0.7 0.3 - 1.2 mg/dL   GFR calc non Af Amer >60 >60 mL/min   GFR calc Af Amer >60 >60 mL/min    Comment: (NOTE) The eGFR has been calculated using the CKD EPI equation. This calculation has not been validated in all clinical situations. eGFR's persistently <60 mL/min signify possible Chronic Kidney Disease.    Anion gap 10 5 - 15    Comment: Performed at [facility], [place] [address], [place],  [address]  Type and screen     Status: None   Collection Time: 01/15/18  9:13 AM  Result Value Ref Range   ABO/RH(D) A POS    Antibody Screen POS    Sample Expiration 01/18/2018    Extend sample reason NO TRANSFUSIONS OR PREGNANCY IN THE PAST 3 MONTHS    Antibody Identification ANTI E    PT AG Type NEGATIVE FOR E ANTIGEN   Hemoglobin A1c     Status: Abnormal   Collection Time: 01/15/18  9:13 AM  Result Value Ref Range   Hgb A1c MFr Bld 5.9 (H) 4.8 - 5.6 %    Comment: (NOTE) Pre diabetes:           5.7%-6.4% Diabetes:              >6.4% Glycemic control for   <7.0% adults with diabetes    Mean Plasma Glucose 122.63 mg/dL  Comment: Performed at [facility], [place] [address] [place], [place] [address]     RADIOLOGY RESULTS: No results found.  Problem List: Patient Active Problem List   Diagnosis Date Noted  . Morbid obesity with body mass index (BMI) of 45.0 to 49.9 in adult [facility]) 01/16/2018  . Hiatal hernia-large type III 01/16/2018    Assessment & Plan: Morbid obesity and hiatal hernia.  I have discussed the operation (roux en Y gastric bypass with her along with repair of the hiatal hernia with biologic mesh and with possible g tube placement) with her in some detail      Matt B. Hassell Done, MD, [facility] [phone] beeper [phone]  01/16/2018 3:37 PM

## 2018-01-22 ENCOUNTER — Encounter (HOSPITAL_COMMUNITY): Payer: Self-pay

## 2018-01-22 ENCOUNTER — Encounter (HOSPITAL_COMMUNITY)
Admission: RE | Disposition: A | Discharge status: Home / Self Care | Payer: Self-pay | Source: Home / Self Care | Attending: Surgery

## 2018-01-22 ENCOUNTER — Other Ambulatory Visit: Payer: Self-pay

## 2018-01-22 ENCOUNTER — Inpatient Hospital Stay (HOSPITAL_COMMUNITY): Payer: Medicare Other | Admitting: Anesthesiology

## 2018-01-22 ENCOUNTER — Inpatient Hospital Stay (HOSPITAL_COMMUNITY)
Admission: RE | Admit: 2018-01-22 | Discharge: 2018-01-24 | DRG: 621 | Disposition: A | Discharge status: Home / Self Care | Payer: Medicare Other | Attending: Surgery | Admitting: Surgery

## 2018-01-22 DIAGNOSIS — M2550 Pain in unspecified joint: Secondary | ICD-10-CM | POA: Diagnosis not present

## 2018-01-22 DIAGNOSIS — I10 Essential (primary) hypertension: Secondary | ICD-10-CM | POA: Diagnosis not present

## 2018-01-22 DIAGNOSIS — Z91013 Allergy to seafood: Secondary | ICD-10-CM

## 2018-01-22 DIAGNOSIS — Z87891 Personal history of nicotine dependence: Secondary | ICD-10-CM | POA: Diagnosis not present

## 2018-01-22 DIAGNOSIS — Z79899 Other long term (current) drug therapy: Secondary | ICD-10-CM | POA: Diagnosis not present

## 2018-01-22 DIAGNOSIS — Z79890 Hormone replacement therapy (postmenopausal): Secondary | ICD-10-CM

## 2018-01-22 DIAGNOSIS — Z882 Allergy status to sulfonamides status: Secondary | ICD-10-CM | POA: Diagnosis not present

## 2018-01-22 DIAGNOSIS — R7303 Prediabetes: Secondary | ICD-10-CM | POA: Diagnosis present

## 2018-01-22 DIAGNOSIS — Z6841 Body Mass Index (BMI) 40.0 and over, adult: Secondary | ICD-10-CM | POA: Diagnosis not present

## 2018-01-22 DIAGNOSIS — Z9884 Bariatric surgery status: Secondary | ICD-10-CM

## 2018-01-22 DIAGNOSIS — Z880 Allergy status to penicillin: Secondary | ICD-10-CM

## 2018-01-22 DIAGNOSIS — E890 Postprocedural hypothyroidism: Secondary | ICD-10-CM | POA: Diagnosis not present

## 2018-01-22 DIAGNOSIS — K449 Diaphragmatic hernia without obstruction or gangrene: Secondary | ICD-10-CM

## 2018-01-22 DIAGNOSIS — E039 Hypothyroidism, unspecified: Secondary | ICD-10-CM | POA: Diagnosis not present

## 2018-01-22 HISTORY — PX: GASTRIC ROUX-EN-Y: SHX5262

## 2018-01-22 HISTORY — PX: HIATAL HERNIA REPAIR: SHX195

## 2018-01-22 LAB — CBC
HEMATOCRIT: 41.1 % (ref 36.0–46.0)
HEMOGLOBIN: 13.6 g/dL (ref 12.0–15.0)
MCH: 29.4 pg (ref 26.0–34.0)
MCHC: 33.1 g/dL (ref 30.0–36.0)
MCV: 89 fL (ref 78.0–100.0)
Platelets: 259 10*3/uL (ref 150–400)
RBC: 4.62 MIL/uL (ref 3.87–5.11)
RDW: 15.2 % (ref 11.5–15.5)
WBC: 16 10*3/uL — AB (ref 4.0–10.5)

## 2018-01-22 LAB — CREATININE, SERUM
Creatinine, Ser: 0.87 mg/dL (ref 0.44–1.00)
GFR calc Af Amer: >60 mL/min (ref >60)

## 2018-01-22 SURGERY — LAPAROSCOPIC ROUX-EN-Y GASTRIC BYPASS WITH UPPER ENDOSCOPY
Anesthesia: General

## 2018-01-22 MED ORDER — PHENYLEPHRINE 40 MCG/ML (10ML) SYRINGE FOR IV PUSH (FOR BLOOD PRESSURE SUPPORT)
PREFILLED_SYRINGE | INTRAVENOUS | Status: DC | PRN
  Administered 2018-01-22: 120 ug via INTRAVENOUS
  Administered 2018-01-22: 80 ug via INTRAVENOUS
  Administered 2018-01-22: 120 ug via INTRAVENOUS
  Administered 2018-01-22: 80 ug via INTRAVENOUS

## 2018-01-22 MED ORDER — TISSEEL VH 10 ML EX KIT
PACK | CUTANEOUS | Status: AC
  Filled 2018-01-22: qty 1

## 2018-01-22 MED ORDER — BUPIVACAINE LIPOSOME 1.3 % IJ SUSP
INTRAMUSCULAR | Status: DC | PRN
  Administered 2018-01-22: 20 mL

## 2018-01-22 MED ORDER — ONDANSETRON HCL 4 MG/2ML IJ SOLN
INTRAMUSCULAR | Status: DC | PRN
  Administered 2018-01-22: 4 mg via INTRAVENOUS

## 2018-01-22 MED ORDER — SODIUM CHLORIDE 0.9 % IJ SOLN
INTRAMUSCULAR | Status: AC
  Filled 2018-01-22: qty 10

## 2018-01-22 MED ORDER — LACTATED RINGERS IR SOLN
Status: DC | PRN
  Administered 2018-01-22: 1000 mL

## 2018-01-22 MED ORDER — FENTANYL CITRATE (PF) 100 MCG/2ML IJ SOLN
INTRAMUSCULAR | Status: DC | PRN
  Administered 2018-01-22 (×2): 50 ug via INTRAVENOUS

## 2018-01-22 MED ORDER — APREPITANT 40 MG PO CAPS
40.0000 mg | ORAL_CAPSULE | ORAL | Status: AC
  Administered 2018-01-22: 40 mg via ORAL
  Filled 2018-01-22: qty 1

## 2018-01-22 MED ORDER — LIDOCAINE 20MG/ML (2%) 15 ML SYRINGE OPTIME
INTRAMUSCULAR | Status: DC | PRN
  Administered 2018-01-22: 1.5 mg/kg/h via INTRAVENOUS

## 2018-01-22 MED ORDER — BUPIVACAINE LIPOSOME 1.3 % IJ SUSP
20.0000 mL | Freq: Once | INTRAMUSCULAR | Status: DC
  Filled 2018-01-22: qty 20

## 2018-01-22 MED ORDER — GABAPENTIN 100 MG PO CAPS
200.0000 mg | ORAL_CAPSULE | Freq: Two times a day (BID) | ORAL | Status: DC
  Administered 2018-01-23 – 2018-01-24 (×3): 200 mg via ORAL
  Filled 2018-01-22 (×4): qty 2

## 2018-01-22 MED ORDER — LIDOCAINE 2% (20 MG/ML) 5 ML SYRINGE
INTRAMUSCULAR | Status: AC
  Filled 2018-01-22: qty 5

## 2018-01-22 MED ORDER — CHLORHEXIDINE GLUCONATE CLOTH 2 % EX PADS: 6.0000 | MEDICATED_PAD | Freq: Once | CUTANEOUS | Status: DC

## 2018-01-22 MED ORDER — SCOPOLAMINE 1 MG/3DAYS TD PT72
1.0000 | MEDICATED_PATCH | TRANSDERMAL | Status: DC
  Administered 2018-01-22: 1.5 mg via TRANSDERMAL
  Filled 2018-01-22: qty 1

## 2018-01-22 MED ORDER — PROPOFOL 10 MG/ML IV BOLUS
INTRAVENOUS | Status: DC | PRN
  Administered 2018-01-22: 160 mg via INTRAVENOUS

## 2018-01-22 MED ORDER — MORPHINE SULFATE (PF) 2 MG/ML IV SOLN: 1.0000 mg | INTRAVENOUS | Status: DC | PRN

## 2018-01-22 MED ORDER — SUCCINYLCHOLINE CHLORIDE 200 MG/10ML IV SOSY
PREFILLED_SYRINGE | INTRAVENOUS | Status: AC
  Filled 2018-01-22: qty 10

## 2018-01-22 MED ORDER — HEPARIN SODIUM (PORCINE) 5000 UNIT/ML IJ SOLN
5000.0000 [IU] | Freq: Three times a day (TID) | INTRAMUSCULAR | Status: DC
  Administered 2018-01-22 – 2018-01-24 (×5): 5000 [IU] via SUBCUTANEOUS
  Filled 2018-01-22 (×5): qty 1

## 2018-01-22 MED ORDER — FENTANYL CITRATE (PF) 100 MCG/2ML IJ SOLN
INTRAMUSCULAR | Status: AC
  Filled 2018-01-22: qty 2

## 2018-01-22 MED ORDER — DEXAMETHASONE SODIUM PHOSPHATE 10 MG/ML IJ SOLN
INTRAMUSCULAR | Status: DC | PRN
  Administered 2018-01-22: 4 mg via INTRAVENOUS

## 2018-01-22 MED ORDER — PHENYLEPHRINE HCL 10 MG/ML IJ SOLN
INTRAMUSCULAR | Status: AC
  Filled 2018-01-22: qty 1

## 2018-01-22 MED ORDER — LIDOCAINE 2% (20 MG/ML) 5 ML SYRINGE
INTRAMUSCULAR | Status: DC | PRN
  Administered 2018-01-22: 100 mg via INTRAVENOUS

## 2018-01-22 MED ORDER — KCL IN DEXTROSE-NACL 20-5-0.45 MEQ/L-%-% IV SOLN
INTRAVENOUS | Status: DC
  Administered 2018-01-22 – 2018-01-24 (×7): via INTRAVENOUS
  Filled 2018-01-22 (×6): qty 1000

## 2018-01-22 MED ORDER — MIDAZOLAM HCL 2 MG/2ML IJ SOLN
INTRAMUSCULAR | Status: AC
  Filled 2018-01-22: qty 2

## 2018-01-22 MED ORDER — ROCURONIUM BROMIDE 10 MG/ML (PF) SYRINGE
PREFILLED_SYRINGE | INTRAVENOUS | Status: DC | PRN
  Administered 2018-01-22: 50 mg via INTRAVENOUS
  Administered 2018-01-22: 20 mg via INTRAVENOUS
  Administered 2018-01-22: 10 mg via INTRAVENOUS
  Administered 2018-01-22 (×4): 20 mg via INTRAVENOUS

## 2018-01-22 MED ORDER — PROMETHAZINE HCL 25 MG/ML IJ SOLN: 6.2500 mg | INTRAMUSCULAR | Status: DC | PRN

## 2018-01-22 MED ORDER — MIDAZOLAM HCL 5 MG/5ML IJ SOLN
INTRAMUSCULAR | Status: DC | PRN
  Administered 2018-01-22: 2 mg via INTRAVENOUS

## 2018-01-22 MED ORDER — OXYCODONE HCL 5 MG/5ML PO SOLN
5.0000 mg | ORAL | Status: DC | PRN
  Administered 2018-01-22 – 2018-01-23 (×2): 5 mg via ORAL
  Filled 2018-01-22 (×2): qty 5

## 2018-01-22 MED ORDER — DEXAMETHASONE SODIUM PHOSPHATE 10 MG/ML IJ SOLN
INTRAMUSCULAR | Status: AC
  Filled 2018-01-22: qty 1

## 2018-01-22 MED ORDER — ONDANSETRON HCL 4 MG/2ML IJ SOLN
INTRAMUSCULAR | Status: AC
  Filled 2018-01-22: qty 2

## 2018-01-22 MED ORDER — KETOROLAC TROMETHAMINE 30 MG/ML IJ SOLN: 30.0000 mg | Freq: Once | INTRAMUSCULAR | Status: DC | PRN

## 2018-01-22 MED ORDER — 0.9 % SODIUM CHLORIDE (POUR BTL) OPTIME
TOPICAL | Status: DC | PRN
  Administered 2018-01-22: 1000 mL

## 2018-01-22 MED ORDER — PREMIER PROTEIN SHAKE
2.0000 [oz_av] | ORAL | Status: DC
  Administered 2018-01-23 – 2018-01-24 (×12): 2 [oz_av] via ORAL

## 2018-01-22 MED ORDER — TISSEEL VH 10 ML EX KIT
PACK | CUTANEOUS | Status: AC
  Filled 2018-01-22: qty 2

## 2018-01-22 MED ORDER — KETAMINE HCL 10 MG/ML IJ SOLN
INTRAMUSCULAR | Status: AC
  Filled 2018-01-22: qty 1

## 2018-01-22 MED ORDER — FAMOTIDINE IN NACL 20-0.9 MG/50ML-% IV SOLN
20.0000 mg | Freq: Two times a day (BID) | INTRAVENOUS | Status: DC
  Administered 2018-01-22 – 2018-01-24 (×5): 20 mg via INTRAVENOUS
  Filled 2018-01-22 (×5): qty 50

## 2018-01-22 MED ORDER — FENTANYL CITRATE (PF) 100 MCG/2ML IJ SOLN: 25.0000 ug | INTRAMUSCULAR | Status: DC | PRN

## 2018-01-22 MED ORDER — ACETAMINOPHEN 500 MG PO TABS
1000.0000 mg | ORAL_TABLET | ORAL | Status: AC
  Administered 2018-01-22: 1000 mg via ORAL
  Filled 2018-01-22: qty 2

## 2018-01-22 MED ORDER — ONDANSETRON HCL 4 MG/2ML IJ SOLN: 4.0000 mg | INTRAMUSCULAR | Status: DC | PRN

## 2018-01-22 MED ORDER — TISSEEL VH 10 ML EX KIT
PACK | CUTANEOUS | Status: DC | PRN
  Administered 2018-01-22 (×2): 1

## 2018-01-22 MED ORDER — HEPARIN SODIUM (PORCINE) 5000 UNIT/ML IJ SOLN
5000.0000 [IU] | INTRAMUSCULAR | Status: AC
  Administered 2018-01-22: 5000 [IU] via SUBCUTANEOUS
  Filled 2018-01-22: qty 1

## 2018-01-22 MED ORDER — ROCURONIUM BROMIDE 100 MG/10ML IV SOLN
INTRAVENOUS | Status: AC
  Filled 2018-01-22: qty 1

## 2018-01-22 MED ORDER — LIDOCAINE HCL 2 % IJ SOLN
INTRAMUSCULAR | Status: AC
  Filled 2018-01-22: qty 20

## 2018-01-22 MED ORDER — GABAPENTIN 300 MG PO CAPS
300.0000 mg | ORAL_CAPSULE | ORAL | Status: AC
  Administered 2018-01-22: 300 mg via ORAL
  Filled 2018-01-22: qty 1

## 2018-01-22 MED ORDER — SUGAMMADEX SODIUM 500 MG/5ML IV SOLN
INTRAVENOUS | Status: DC | PRN
  Administered 2018-01-22: 300 mg via INTRAVENOUS

## 2018-01-22 MED ORDER — SUCCINYLCHOLINE CHLORIDE 200 MG/10ML IV SOSY
PREFILLED_SYRINGE | INTRAVENOUS | Status: DC | PRN
  Administered 2018-01-22: 120 mg via INTRAVENOUS

## 2018-01-22 MED ORDER — CELECOXIB 200 MG PO CAPS
400.0000 mg | ORAL_CAPSULE | ORAL | Status: AC
  Administered 2018-01-22: 400 mg via ORAL
  Filled 2018-01-22: qty 2

## 2018-01-22 MED ORDER — METOPROLOL TARTRATE 5 MG/5ML IV SOLN: 5.0000 mg | Freq: Four times a day (QID) | INTRAVENOUS | Status: DC | PRN

## 2018-01-22 MED ORDER — SODIUM CHLORIDE 0.9 % IJ SOLN
INTRAMUSCULAR | Status: DC | PRN
  Administered 2018-01-22: 10 mL

## 2018-01-22 MED ORDER — KETAMINE HCL 10 MG/ML IJ SOLN
INTRAMUSCULAR | Status: DC | PRN
  Administered 2018-01-22 (×3): 10 mg via INTRAVENOUS
  Administered 2018-01-22: 30 mg via INTRAVENOUS

## 2018-01-22 MED ORDER — PROPOFOL 10 MG/ML IV BOLUS
INTRAVENOUS | Status: AC
  Filled 2018-01-22: qty 20

## 2018-01-22 MED ORDER — LACTATED RINGERS IV SOLN
INTRAVENOUS | Status: DC
  Administered 2018-01-22 (×3): via INTRAVENOUS

## 2018-01-22 MED ORDER — HYDRALAZINE HCL 20 MG/ML IJ SOLN: 10.0000 mg | INTRAMUSCULAR | Status: DC | PRN

## 2018-01-22 MED ORDER — SODIUM CHLORIDE 0.9 % IV SOLN
INTRAVENOUS | Status: DC | PRN
  Administered 2018-01-22: 30 ug/min via INTRAVENOUS

## 2018-01-22 MED ORDER — CEFOTETAN DISODIUM 2 G IJ SOLR
2.0000 g | INTRAMUSCULAR | Status: AC
  Administered 2018-01-22: 2 g via INTRAVENOUS
  Filled 2018-01-22: qty 2

## 2018-01-22 MED ORDER — ACETAMINOPHEN 160 MG/5ML PO SOLN
650.0000 mg | Freq: Four times a day (QID) | ORAL | Status: DC
  Administered 2018-01-22 – 2018-01-24 (×6): 650 mg via ORAL
  Filled 2018-01-22 (×6): qty 20.3

## 2018-01-22 MED ORDER — DEXAMETHASONE SODIUM PHOSPHATE 4 MG/ML IJ SOLN: 4.0000 mg | INTRAMUSCULAR | Status: DC

## 2018-01-22 SURGICAL SUPPLY — 69 items
APPLICATOR COTTON TIP 6 STRL (MISCELLANEOUS) IMPLANT
APPLICATOR COTTON TIP 6IN STRL (MISCELLANEOUS)
APPLIER CLIP ROT 10 11.4 M/L (STAPLE)
APPLIER CLIP ROT 13.4 12 LRG (CLIP)
BENZOIN TINCTURE PRP APPL 2/3 (GAUZE/BANDAGES/DRESSINGS) IMPLANT
BLADE SURG 15 STRL LF DISP TIS (BLADE) ×1 IMPLANT
BLADE SURG 15 STRL SS (BLADE) ×1
CABLE HIGH FREQUENCY MONO STRZ (ELECTRODE) IMPLANT
CLIP APPLIE ROT 10 11.4 M/L (STAPLE) IMPLANT
CLIP APPLIE ROT 13.4 12 LRG (CLIP) IMPLANT
CLIP SUT LAPRA TY ABSORB (SUTURE) ×6 IMPLANT
DERMABOND ADVANCED (GAUZE/BANDAGES/DRESSINGS) ×1
DERMABOND ADVANCED .7 DNX12 (GAUZE/BANDAGES/DRESSINGS) ×1 IMPLANT
DEVICE SUT QUICK LOAD TK 5 (STAPLE) ×10 IMPLANT
DEVICE SUT TI-KNOT TK 5X26 (MISCELLANEOUS) ×2 IMPLANT
DEVICE SUTURE ENDOST 10MM (ENDOMECHANICALS) ×4 IMPLANT
DISSECTOR BLUNT TIP ENDO 5MM (MISCELLANEOUS) IMPLANT
DRAIN PENROSE 18X1/2 LTX STRL (DRAIN) ×2 IMPLANT
DRAIN PENROSE 18X1/4 LTX STRL (WOUND CARE) ×2 IMPLANT
GAUZE 4X4 16PLY RFD (DISPOSABLE) ×2 IMPLANT
GAUZE SPONGE 4X4 12PLY STRL (GAUZE/BANDAGES/DRESSINGS) IMPLANT
GLOVE BIOGEL M 8.0 STRL (GLOVE) ×2 IMPLANT
GOWN STRL REUS W/TWL XL LVL3 (GOWN DISPOSABLE) ×8 IMPLANT
GRASPER ENDO BABCOCK 10 (MISCELLANEOUS) ×1 IMPLANT
GRASPER ENDO BABCOCK 10MM (MISCELLANEOUS) ×1
HANDLE STAPLE EGIA 4 XL (STAPLE) ×2 IMPLANT
HOVERMATT SINGLE USE (MISCELLANEOUS) ×2 IMPLANT
KIT BASIN OR (CUSTOM PROCEDURE TRAY) ×2 IMPLANT
KIT GASTRIC LAVAGE 34FR ADT (SET/KITS/TRAYS/PACK) ×2 IMPLANT
MARKER SKIN DUAL TIP RULER LAB (MISCELLANEOUS) ×2 IMPLANT
NEEDLE SPNL 22GX3.5 QUINCKE BK (NEEDLE) ×2 IMPLANT
PACK CARDIOVASCULAR III (CUSTOM PROCEDURE TRAY) ×2 IMPLANT
RELOAD EGIA 45 MED/THCK PURPLE (STAPLE) ×2 IMPLANT
RELOAD EGIA 45 TAN VASC (STAPLE) IMPLANT
RELOAD EGIA 60 MED/THCK PURPLE (STAPLE) ×4 IMPLANT
RELOAD EGIA 60 TAN VASC (STAPLE) ×4 IMPLANT
RELOAD ENDO STITCH 2.0 (ENDOMECHANICALS) ×11
RELOAD TRI 45 ART MED THCK PUR (STAPLE) IMPLANT
RELOAD TRI 60 ART MED THCK PUR (STAPLE) ×4 IMPLANT
SCISSORS LAP 5X45 EPIX DISP (ENDOMECHANICALS) ×2 IMPLANT
SEALANT SURGICAL APPL DUAL CAN (MISCELLANEOUS) ×4 IMPLANT
SET IRRIG TUBING LAPAROSCOPIC (IRRIGATION / IRRIGATOR) ×2 IMPLANT
SHEARS HARMONIC ACE PLUS 45CM (MISCELLANEOUS) ×2 IMPLANT
SLEEVE ADV FIXATION 12X100MM (TROCAR) ×4 IMPLANT
SLEEVE ADV FIXATION 5X100MM (TROCAR) IMPLANT
SOLUTION ANTI FOG 6CC (MISCELLANEOUS) ×2 IMPLANT
STAPLER VISISTAT 35W (STAPLE) ×2 IMPLANT
STRIP CLOSURE SKIN 1/2X4 (GAUZE/BANDAGES/DRESSINGS) IMPLANT
SUT RELOAD ENDO STITCH 2 48X1 (ENDOMECHANICALS) ×6
SUT RELOAD ENDO STITCH 2.0 (ENDOMECHANICALS) ×5
SUT SURGIDAC NAB ES-9 0 48 120 (SUTURE) ×14 IMPLANT
SUT VIC AB 2-0 SH 27 (SUTURE) ×1
SUT VIC AB 2-0 SH 27X BRD (SUTURE) ×1 IMPLANT
SUT VIC AB 4-0 SH 18 (SUTURE) ×2 IMPLANT
SUTURE RELOAD END STTCH 2 48X1 (ENDOMECHANICALS) ×6 IMPLANT
SUTURE RELOAD ENDO STITCH 2.0 (ENDOMECHANICALS) ×5 IMPLANT
SYR 10ML ECCENTRIC (SYRINGE) ×2 IMPLANT
SYR 20CC LL (SYRINGE) ×4 IMPLANT
SYR 50ML LL SCALE MARK (SYRINGE) ×2 IMPLANT
TOWEL OR 17X26 10 PK STRL BLUE (TOWEL DISPOSABLE) ×4 IMPLANT
TOWEL OR NON WOVEN STRL DISP B (DISPOSABLE) ×2 IMPLANT
TRAY FOLEY W/BAG SLVR 14FR (SET/KITS/TRAYS/PACK) ×2 IMPLANT
TROCAR ADV FIXATION 12X100MM (TROCAR) ×2 IMPLANT
TROCAR ADV FIXATION 5X100MM (TROCAR) ×2 IMPLANT
TROCAR BLADELESS OPT 5 100 (ENDOMECHANICALS) ×2 IMPLANT
TROCAR XCEL 12X100 BLDLESS (ENDOMECHANICALS) ×2 IMPLANT
TUBE CALIBRATION LAPBAND (TUBING) ×2 IMPLANT
TUBING CONNECTING 10 (TUBING) ×4 IMPLANT
TUBING INSUF HEATED (TUBING) IMPLANT

## 2018-01-22 NOTE — Progress Notes (Signed)
Pt started drinking first 2oz of water at 1845.

## 2018-01-22 NOTE — Transfer of Care (Signed)
Immediate Anesthesia Transfer of Care Note  Patient: Carol Sanchez  Procedure(s) Performed: LAPAROSCOPIC ROUX-EN-Y GASTRIC BYPASS WITH UPPER ENDOSCOPY (N/A ) LAPAROSCOPIC REPAIR OF HIATAL HERNIA (N/A )  Patient Location: PACU  Anesthesia Type:General  Level of Consciousness: sedated  Airway & Oxygen Therapy: Patient Spontanous Breathing and Patient connected to face mask oxygen  Post-op Assessment: Report given to RN and Post -op Vital signs reviewed and stable  Post vital signs: Reviewed and stable  Last Vitals:  Vitals Value Taken Time  BP    Temp    Pulse    Resp 15 01/22/2018  2:04 PM  SpO2    Vitals shown include unvalidated device data.  Last Pain:  Vitals:   01/22/18 0906  TempSrc:   PainSc: 0-No pain         Complications: No apparent anesthesia complications

## 2018-01-22 NOTE — Interval H&P Note (Signed)
History and Physical Interval Note:  01/22/2018 9:17 AM  Cyndia Skeeters  has presented today for surgery, with the diagnosis of Morbid Obesity, HTN, Metabolic Syndrome, Hyperlipidemia  The various methods of treatment have been discussed with the patient and family. After consideration of risks, benefits and other options for treatment, the patient has consented to  Procedure(s): LAPAROSCOPIC ROUX-EN-Y GASTRIC BYPASS WITH UPPER ENDOSCOPY (N/A) as a surgical intervention .  The patient's history has been reviewed, patient examined, no change in status, stable for surgery.  I have reviewed the patient's chart and labs.  Questions were answered to the patient's satisfaction.     Carol Sanchez

## 2018-01-22 NOTE — Op Note (Signed)
Preoperative diagnosis: laparoscopic repair of large paraesophageal hernia, roux en y gastric bypass  Postoperative diagnosis: Same   Procedure: Upper endoscopy   Surgeon: Clovis Riley, M.D.  Anesthesia: Gen.   Description of procedure: The endoscopy was placed in the mouth and into the oropharynx and under endoscopic vision it was advanced to the esophagogastric junction.  The pouch was insufflated and no bleeding or bubbles were seen.  The GEJ was identified at 34cm from the teeth. The gastrojejunostomy was 40cm from the teeth and was visibly patent and hemostatic. No bleeding or leaks were detected. The scope was withdrawn without difficulty.    Clovis Riley, M.D. General, Bariatric, & Minimally Invasive Surgery [facility]

## 2018-01-22 NOTE — Anesthesia Postprocedure Evaluation (Signed)
Anesthesia Post Note  Patient: Carol Sanchez  Procedure(s) Performed: LAPAROSCOPIC ROUX-EN-Y GASTRIC BYPASS WITH UPPER ENDOSCOPY (N/A ) LAPAROSCOPIC REPAIR OF HIATAL HERNIA (N/A )     Patient location during evaluation: PACU Anesthesia Type: General Level of consciousness: awake and alert Pain management: pain level controlled Vital Signs Assessment: post-procedure vital signs reviewed and stable Respiratory status: spontaneous breathing, nonlabored ventilation, respiratory function stable and patient connected to nasal cannula oxygen Cardiovascular status: blood pressure returned to baseline and stable Postop Assessment: no apparent nausea or vomiting Anesthetic complications: no    Last Vitals:  Vitals:   01/22/18 1500 01/22/18 1516  BP: 117/68 121/78  Pulse: 69 69  Resp: 16 16  Temp: 36.6 C 36.4 C  SpO2: 96% 96%    Last Pain:  Vitals:   01/22/18 1516  TempSrc: Oral  PainSc:                  Erminia Mcnew S

## 2018-01-22 NOTE — Progress Notes (Signed)
Discussed post op day goals with patient including ambulation, IS, diet progression, pain, and nausea control.  Questions answered. 

## 2018-01-22 NOTE — Progress Notes (Signed)
PHARMACY CONSULT FOR:  Risk Assessment for Post-Discharge VTE Following Bariatric Surgery  Post-Discharge VTE Risk Assessment: This patient's probability of 30-day post-discharge VTE is increased due to the potential factors marked:   Female  X  Age >/=60 years    BMI >/=50 kg/m2    CHF    Dyspnea at Rest    Paraplegia  X  Non-gastric-band surgery  X Operation Time >/=3 hr    Return to OR      Length of Stay >/= 3 d   Predicted probability of 30-day post-discharge VTE: 0.49%  Other patient-specific factors to consider: N/A   Recommendation for Discharge: Enoxaparin 40 mg [place] q12h x 2 weeks post-discharge    Carol Sanchez is a 67 y.o. female who underwent  laparoscopic Roux-en-Y gastric bypass on 01/22/2018   Case start: 1020 Case end: 1354  Currently receiving Heparin 5000 units [place] q8h for inpatient VTE prophylaxis  Allergies  Allergen Reactions  . Shellfish Allergy Hives  . Penicillins Rash    Has patient had a PCN reaction causing immediate rash, facial/tongue/throat swelling, SOB or lightheadedness with hypotension: Yes Has patient had a PCN reaction causing severe rash involving mucus membranes or skin necrosis: No Has patient had a PCN reaction that required hospitalization: No Has patient had a PCN reaction occurring within the last 10 years: No If all of the above answers are "NO", then may proceed with Cephalosporin use.   . Sulfa Antibiotics Rash    Patient Measurements: Height: 5\' 1"  (154.9 cm) Weight: 248 lb 3.2 oz (112.6 kg) IBW/kg (Calculated) : 47.8 Body mass index is 46.9 kg/m.  No results for input(s): WBC, HGB, HCT, PLT, APTT, CREATININE, LABCREA, CREATININE, CREAT24HRUR, MG, PHOS, ALBUMIN, PROT, ALBUMIN, AST, ALT, ALKPHOS, BILITOT, BILIDIR, IBILI in the last 72 hours. Estimated Creatinine Clearance: 81.6 mL/min (by C-G formula based on SCr of 0.76 mg/dL).    Past Medical History:  Diagnosis Date  . Cancer [facility]) 2003   thyroid  .  Hypertension   . Pre-diabetes    Metobolic Syndrome     Medications Prior to Admission  Medication Sig Dispense Refill Last Dose  . acetaminophen (TYLENOL) 500 MG tablet Take 1,000 mg by mouth 2 (two) times daily as needed for headache.   Past Week at Unknown time  . Calcium Carbonate-Vitamin D (CALCIUM-D PO) Take 1 tablet by mouth 2 (two) times daily.   01/21/2018 at Unknown time  . diclofenac sodium (VOLTAREN) 1 % GEL Apply 1 application topically daily as needed (pain).   01/21/2018 at Unknown time  . EPINEPHrine (EPIPEN JR) 0.15 MG/0.3ML injection Inject 0.15 mg into the muscle as needed for anaphylaxis.   1 never used it  . hydrochlorothiazide (HYDRODIURIL) 25 MG tablet Take 25 mg by mouth daily.   01/21/2018 at Unknown time  . levothyroxine (SYNTHROID, LEVOTHROID) 125 MCG tablet Take 125 mcg by mouth every other day. Alternating between 125 mcg and 137 mcg   01/21/2018 at Unknown time  . levothyroxine (SYNTHROID, LEVOTHROID) 137 MCG tablet Take 137 mcg by mouth every other day. Alternating between 125 mcg and 137 mcg   01/22/2018 at 0530  . losartan (COZAAR) 50 MG tablet Take 50 mg by mouth at bedtime.    01/21/2018 at Unknown time  . Multiple Vitamin (MULTIVITAMIN WITH MINERALS) TABS tablet Take 1 tablet by mouth daily.   More than a month at Unknown time  . zolpidem (AMBIEN) 10 MG tablet Take 2.5 mg by mouth at bedtime as needed  for sleep.  1 More than a month at Unknown time   Clayburn Pert, PharmD, BCPS [phone] 01/22/2018  4:39 PM

## 2018-01-22 NOTE — Op Note (Signed)
22 January 2018 Surgeon: Althea Grimmer. Hassell Done, MD, FACS Asst:  Romana Juniper, MD Anesthesia: General endotracheal Drains: None  Procedure: Laparoscopic repair of large type III hiatal hernia; Roux en Y gastric bypass with 40 cm BP limb and 100 cm Roux limb, antecolic, antegastric, candy cane to the left.  Closure of Peterson's defect. Upper endoscopy.   Description of Procedure:  The patient was taken to OR 2 at [facility] and given general anesthesia.  The abdomen was prepped with PCMX and draped sterilely.  A time out was performed.  12 cm trocars were placed -4x with additional 5 mm left lateral + Nathanson retractor port.  Top half of stomach was in the chest.  Harmonic dissection of the foregut allow for complete mobilization and esophagus in the abdomen.  Five sutures were placed in the right and left crurae and this closed the diaphragm.    The operation began by identifying the ligament of Treitz. I measured 40 cm downstream and divided the bowel with a 6 cm Covidian stapler.  I sutured a Penrose drain along the Roux limb end.  I measured a 1 meter (100 cm) Roux limb and then placed the distal bowels to the BP limb side by side and performed a stapled jejunojejunostomy. The common defect was closed from either end with 4-0 Vicryl using the Endo Stitch. The mesenteric defect was closed with a running 2-0 silk using the Endo Stitch. Tisseel was applied to the suture line.  The omentum was divided with the harmonic scalpel.  The Nathanson retractor was inserted in the left lateral segment of liver was retracted. The foregut dissection ensued.  A pouch was constructed with multiple firings of the Covidien stapler with 6 purple x2 and then the remaining with TRS.    The Roux limb was then brought up with the candycane pointed left and a back row of sutures of 2-0 Vicryl were placed. I opened along the right side of each structure and inserted the 4.5 cm stapler to create the gastrojejunostomy. The common defect  was closed from either end with 2-0 Vicryl and a second row was placed anterior to that the Ewald tube acting as a stent across the anastomosis. The Penrose drain was removed. Peterson's defect was closed with 2-0 silk.       Endoscopy was performed by Dr. Kae Heller and no bubbles were seen  The incisions were injected with Exparel and were closed with 4-0 Vicryl and Dermabond  The patient was taken to the recovery room in satisfactory condition.  Matt B. Hassell Done, MD, FACS

## 2018-01-22 NOTE — Anesthesia Preprocedure Evaluation (Signed)
Anesthesia Evaluation  Patient identified by MRN, date of birth, ID band Patient awake    Reviewed: Allergy & Precautions, NPO status , Patient's Chart, lab work & pertinent test results  Airway Mallampati: II  TM Distance: >3 FB Neck ROM: Full    Dental no notable dental hx.    Pulmonary neg pulmonary ROS, former smoker,    Pulmonary exam normal breath sounds clear to auscultation       Cardiovascular hypertension, Normal cardiovascular exam Rhythm:Regular Rate:Normal     Neuro/Psych negative neurological ROS  negative psych ROS   GI/Hepatic Neg liver ROS, hiatal hernia,   Endo/Other  Hypothyroidism   Renal/GU negative Renal ROS  negative genitourinary   Musculoskeletal negative musculoskeletal ROS (+)   Abdominal   Peds negative pediatric ROS (+)  Hematology negative hematology ROS (+)   Anesthesia Other Findings   Reproductive/Obstetrics negative OB ROS                             Anesthesia Physical Anesthesia Plan  ASA: III  Anesthesia Plan: General   Post-op Pain Management:    Induction: Intravenous  PONV Risk Score and Plan: 3 and Ondansetron, Dexamethasone, Treatment may vary due to age or medical condition and Scopolamine patch - Pre-op  Airway Management Planned: Oral ETT  Additional Equipment:   Intra-op Plan:   Post-operative Plan: Extubation in OR  Informed Consent: I have reviewed the patients History and Physical, chart, labs and discussed the procedure including the risks, benefits and alternatives for the proposed anesthesia with the patient or authorized representative who has indicated his/her understanding and acceptance.   Dental advisory given  Plan Discussed with: CRNA and Surgeon  Anesthesia Plan Comments:         Anesthesia Quick Evaluation

## 2018-01-22 NOTE — Anesthesia Procedure Notes (Signed)
Procedure Name: Intubation Date/Time: 01/22/2018 9:45 AM Performed by: Lind Covert, CRNA Pre-anesthesia Checklist: Patient identified, Emergency Drugs available, Suction available, Patient being monitored and Timeout performed Patient Re-evaluated:Patient Re-evaluated prior to induction Oxygen Delivery Method: Circle system utilized Preoxygenation: Pre-oxygenation with 100% oxygen Induction Type: IV induction Ventilation: Mask ventilation without difficulty Laryngoscope Size: Mac and 3 Grade View: Grade II Tube size: 7.0 mm Number of attempts: 1 Airway Equipment and Method: Stylet Placement Confirmation: ETT inserted through vocal cords under direct vision,  positive ETCO2 and breath sounds checked- equal and bilateral Secured at: 21 cm Tube secured with: Tape Dental Injury: Teeth and Oropharynx as per pre-operative assessment

## 2018-01-23 ENCOUNTER — Encounter (HOSPITAL_COMMUNITY): Payer: Self-pay | Admitting: Surgery

## 2018-01-23 LAB — CBC WITH DIFFERENTIAL/PLATELET
BASOS PCT: 0 %
Basophils Absolute: 0 10*3/uL (ref 0.0–0.1)
EOS ABS: 0 10*3/uL (ref 0.0–0.7)
EOS PCT: 0 %
HCT: 38.4 % (ref 36.0–46.0)
Hemoglobin: 12.5 g/dL (ref 12.0–15.0)
LYMPHS PCT: 10 %
Lymphs Abs: 1.4 10*3/uL (ref 0.7–4.0)
MCH: 29.1 pg (ref 26.0–34.0)
MCHC: 32.6 g/dL (ref 30.0–36.0)
MCV: 89.3 fL (ref 78.0–100.0)
MONO ABS: 0.8 10*3/uL (ref 0.1–1.0)
Monocytes Relative: 6 %
Neutro Abs: 11.7 10*3/uL — ABNORMAL HIGH (ref 1.7–7.7)
Neutrophils Relative %: 84 %
PLATELETS: 256 10*3/uL (ref 150–400)
RBC: 4.3 MIL/uL (ref 3.87–5.11)
RDW: 15.4 % (ref 11.5–15.5)
WBC: 13.9 10*3/uL — ABNORMAL HIGH (ref 4.0–10.5)

## 2018-01-23 MED ORDER — LEVOTHYROXINE SODIUM 25 MCG PO TABS
125.0000 ug | ORAL_TABLET | Freq: Every day | ORAL | Status: DC
  Administered 2018-01-23: 125 ug via ORAL

## 2018-01-23 MED ORDER — LEVOTHYROXINE SODIUM 25 MCG PO TABS: 137.0000 ug | ORAL_TABLET | Freq: Every day | ORAL | Status: DC

## 2018-01-23 MED ORDER — LEVOTHYROXINE SODIUM 25 MCG PO TABS
137.0000 ug | ORAL_TABLET | Freq: Every day | ORAL | Status: AC
  Administered 2018-01-24: 137 ug via ORAL
  Filled 2018-01-23: qty 1

## 2018-01-23 MED ORDER — LEVOTHYROXINE SODIUM 25 MCG PO TABS
125.0000 ug | ORAL_TABLET | Freq: Every day | ORAL | Status: AC
  Administered 2018-01-23: 125 ug via ORAL
  Filled 2018-01-23: qty 1

## 2018-01-23 NOTE — Progress Notes (Signed)
Patient alert and oriented, Post op day 1.  Provided support and encouragement.  Encouraged pulmonary toilet, ambulation and small sips of liquids.  Completed 12 ounces of bari clear fluids. All questions answered.  Will continue to monitor.

## 2018-01-23 NOTE — Progress Notes (Signed)
Patient ID: Carol Sanchez, female   DOB: 1950/09/10, 67 y.o.   MRN: [id number] [facility] Progress Note:   1 Day Post-Op  Subjective: Mental status is clear.   Objective: Vital signs in last 24 hours: Temp:  [97.4 F (36.3 C)-98.7 F (37.1 C)] 98.4 F (36.9 C) (08/28 1348) Pulse Rate:  [70-79] 74 (08/28 1348) Resp:  [15-18] 17 (08/28 1348) BP: (91-140)/(61-90) 108/67 (08/28 1348) SpO2:  [91 %-99 %] 95 % (08/28 1348) Weight:  [112.6 kg] 112.6 kg (08/28 0521)  Intake/Output from previous day: 08/27 0701 - 08/28 0700 In: 5220.3 [P.O.:660; I.V.:4460.3; IV Piggyback:100] Out: 2775 [Urine:2775] Intake/Output this shift: Total I/O In: 544.8 [P.O.:60; I.V.:434.8; IV Piggyback:50] Out: 800 [Urine:800]  Physical Exam: Work of breathing is normal.  Incisions ok  Lab Results:  Results for orders placed or performed during the hospital encounter of 01/22/18 (from the past 48 hour(s))  Type and screen     Status: None (Preliminary result)   Collection Time: 01/22/18  9:25 AM  Result Value Ref Range   ABO/RH(D) A POS    Antibody Screen POS    Sample Expiration      01/25/2018 Performed at [facility], [place] [address], [place], [place] [address]    Unit Number [id number]    Blood Component Type RED CELLS,LR    Unit division 00    Status of Unit ALLOCATED    Donor AG Type NEGATIVE FOR E ANTIGEN    Transfusion Status OK TO TRANSFUSE    Crossmatch Result COMPATIBLE    Unit Number [id number]    Blood Component Type RED CELLS,LR    Unit division 00    Status of Unit ALLOCATED    Donor AG Type NEGATIVE FOR E ANTIGEN    Transfusion Status OK TO TRANSFUSE    Crossmatch Result COMPATIBLE   CBC     Status: Abnormal   Collection Time: 01/22/18  6:01 PM  Result Value Ref Range   WBC 16.0 (H) 4.0 - 10.5 K/uL   RBC 4.62 3.87 - 5.11 MIL/uL   Hemoglobin 13.6 12.0 - 15.0 g/dL   HCT 41.1 36.0 - 46.0 %   MCV 89.0 78.0 - 100.0 fL   MCH 29.4 26.0 - 34.0  pg   MCHC 33.1 30.0 - 36.0 g/dL   RDW 15.2 11.5 - 15.5 %   Platelets 259 150 - 400 K/uL    Comment: Performed at [facility], [place] [address] [place], [place] [address]  Creatinine, serum     Status: None   Collection Time: 01/22/18  6:01 PM  Result Value Ref Range   Creatinine, Ser 0.87 0.44 - 1.00 mg/dL   GFR calc non Af Amer >60 >60 mL/min   GFR calc Af Amer >60 >60 mL/min    Comment: (NOTE) The eGFR has been calculated using the CKD EPI equation. This calculation has not been validated in all clinical situations. eGFR's persistently <60 mL/min signify possible Chronic Kidney Disease. Performed at [facility], [place] [address], [place], [place] [address]   CBC WITH DIFFERENTIAL     Status: Abnormal   Collection Time: 01/23/18  5:30 AM  Result Value Ref Range   WBC 13.9 (H) 4.0 - 10.5 K/uL   RBC 4.30 3.87 - 5.11 MIL/uL   Hemoglobin 12.5 12.0 - 15.0 g/dL   HCT 38.4 36.0 - 46.0 %   MCV 89.3 78.0 - 100.0 fL   MCH 29.1 26.0 - 34.0  pg   MCHC 32.6 30.0 - 36.0 g/dL   RDW 15.4 11.5 - 15.5 %   Platelets 256 150 - 400 K/uL   Neutrophils Relative % 84 %   Neutro Abs 11.7 (H) 1.7 - 7.7 K/uL   Lymphocytes Relative 10 %   Lymphs Abs 1.4 0.7 - 4.0 K/uL   Monocytes Relative 6 %   Monocytes Absolute 0.8 0.1 - 1.0 K/uL   Eosinophils Relative 0 %   Eosinophils Absolute 0.0 0.0 - 0.7 K/uL   Basophils Relative 0 %   Basophils Absolute 0.0 0.0 - 0.1 K/uL    Comment: Performed at [facility], [place] [address], [place], [place] [address]    Radiology/Results: No results found.  Anti-infectives: Anti-infectives (From admission, onward)   Start     Dose/Rate Route Frequency Ordered Stop   01/22/18 0845  cefoTEtan (CEFOTAN) 2 g in sodium chloride 0.9 % 100 mL IVPB     2 g 200 mL/hr over 30 Minutes Intravenous On call to O.R. 01/22/18 0839 01/22/18 0949      Assessment/Plan: Problem List: Patient Active Problem List   Diagnosis  Date Noted  . Roux en Y gastric bypass with repair of large type III hiatal hernia 01/22/2018  . Morbid obesity with body mass index (BMI) of 45.0 to 49.9 in adult [facility]) 01/16/2018  . Hiatal hernia-large type III 01/16/2018    Depressed over family member suicide.  Progressing and should be available for discharge tomorrow.   1 Day Post-Op    LOS: 1 day   Matt B. Hassell Done, MD, [facility] [phone] beeper [phone]  01/23/2018 4:10 PM

## 2018-01-23 NOTE — Care Management Note (Signed)
Case Management Note  Patient Details  Name: Carol Sanchez MRN: [id number] Date of Birth: July 22, 1950  Subjective/Objective:Admitted w/Hiatal Hernia-POD#1 Roux-en-Y gastric bypass. CM referral for benefit check- lovenox/enoxaparin 40mg  sq q12h x2weeks-await co pay from benefit check.                    Action/Plan: d/c home.   Expected Discharge Date:                  Expected Discharge Plan:  Home/Self Care  In-House Referral:     Discharge planning Services  CM Consult  Post Acute Care Choice:    Choice offered to:     DME Arranged:    DME Agency:     HH Arranged:    HH Agency:     Status of Service:  In process, will continue to follow  If discussed at Long Length of Stay Meetings, dates discussed:    Additional Comments:  Dessa Phi, RN 01/23/2018, 12:22 PM

## 2018-01-23 NOTE — Progress Notes (Signed)
Patient's benefit checked for lovenox/enoxaparin-co-pay$115.22(patient can afford) Patient prefers script t be called into her pharmacy.Patient voiced understanding. No further CM needs.

## 2018-01-23 NOTE — Plan of Care (Signed)

## 2018-01-24 LAB — CBC WITH DIFFERENTIAL/PLATELET
Basophils Absolute: 0 10*3/uL (ref 0.0–0.1)
Basophils Relative: 0 %
EOS ABS: 0.2 10*3/uL (ref 0.0–0.7)
EOS PCT: 2 %
HCT: 36.9 % (ref 36.0–46.0)
Hemoglobin: 11.8 g/dL — ABNORMAL LOW (ref 12.0–15.0)
Lymphocytes Relative: 19 %
Lymphs Abs: 1.9 10*3/uL (ref 0.7–4.0)
MCH: 29.2 pg (ref 26.0–34.0)
MCHC: 32 g/dL (ref 30.0–36.0)
MCV: 91.3 fL (ref 78.0–100.0)
Monocytes Absolute: 0.8 10*3/uL (ref 0.1–1.0)
Monocytes Relative: 8 %
Neutro Abs: 7.1 10*3/uL (ref 1.7–7.7)
Neutrophils Relative %: 71 %
PLATELETS: 214 10*3/uL (ref 150–400)
RBC: 4.04 MIL/uL (ref 3.87–5.11)
RDW: 15.6 % — ABNORMAL HIGH (ref 11.5–15.5)
WBC: 9.9 10*3/uL (ref 4.0–10.5)

## 2018-01-24 MED ORDER — GUAIFENESIN ER 600 MG PO TB12
600.0000 mg | ORAL_TABLET | Freq: Two times a day (BID) | ORAL | Status: DC
  Administered 2018-01-24: 600 mg via ORAL
  Filled 2018-01-24: qty 1

## 2018-01-24 MED ORDER — ENOXAPARIN SODIUM 40 MG/0.4ML ~~LOC~~ SOLN: 40.0000 mg | Freq: Two times a day (BID) | SUBCUTANEOUS | 0 refills | Status: DC

## 2018-01-24 MED ORDER — ENOXAPARIN (LOVENOX) PATIENT EDUCATION KIT: 1.0000 | PACK | Freq: Once | 0 refills | Status: AC

## 2018-01-24 MED ORDER — ENOXAPARIN (LOVENOX) PATIENT EDUCATION KIT
PACK | Freq: Once | Status: AC
  Administered 2018-01-24: 13:00:00
  Filled 2018-01-24: qty 1

## 2018-01-24 NOTE — Discharge Summary (Signed)
Physician Discharge Summary  Patient ID: Carol Sanchez MRN: [id number] DOB/AGE: [birth year] 67 y.o.  PCP: Deland Pretty, MD  Admit date: 01/22/2018 Discharge date: 01/24/2018  Admission Diagnoses:  Morbid obesity and large type III hiatal hernia  Discharge Diagnoses:  Post repair of hiatal hernia and roux en Y gastric bypass  Principal Problem:   Roux en Y gastric bypass with repair of large type III hiatal hernia Active Problems:   Morbid obesity with body mass index (BMI) of 45.0 to 49.9 in adult [facility])   Hiatal hernia-large type III   Surgery:  Hiatal hernia repair and gastric bypass  Discharged Condition: improved  Hospital Course:   Had surgery and did well.  Started on clears and advanced and ready for discharge on PD 2.  Lovenox ordered for 2 weeks based on risk profile  Consults: pharmacy  Significant Diagnostic Studies: none    Discharge Exam: Blood pressure 124/82, pulse 68, temperature 98.6 F (37 C), temperature source Oral, resp. rate 16, height 5' 1"  (1.549 m), weight 112.6 kg, SpO2 99 %. Incisions ok  Disposition: Discharge disposition: 01-Home or Self Care       Discharge Instructions    Ambulate hourly while awake   Complete by:  As directed    Call MD for:  difficulty breathing, headache or visual disturbances   Complete by:  As directed    Call MD for:  persistant dizziness or light-headedness   Complete by:  As directed    Call MD for:  persistant nausea and vomiting   Complete by:  As directed    Call MD for:  redness, tenderness, or signs of infection (pain, swelling, redness, odor or green/yellow discharge around incision site)   Complete by:  As directed    Call MD for:  severe uncontrolled pain   Complete by:  As directed    Call MD for:  temperature >101 F   Complete by:  As directed    Diet bariatric full liquid   Complete by:  As directed    Incentive spirometry   Complete by:  As directed    Perform hourly while awake      Allergies as of 01/24/2018      Reactions   Shellfish Allergy Hives   Penicillins Rash   Has patient had a PCN reaction causing immediate rash, facial/tongue/throat swelling, SOB or lightheadedness with hypotension: Yes Has patient had a PCN reaction causing severe rash involving mucus membranes or skin necrosis: No Has patient had a PCN reaction that required hospitalization: No Has patient had a PCN reaction occurring within the last 10 years: No If all of the above answers are "NO", then may proceed with Cephalosporin use.   Sulfa Antibiotics Rash      Medication List    TAKE these medications   acetaminophen 500 MG tablet Commonly known as:  TYLENOL Take 1,000 mg by mouth 2 (two) times daily as needed for headache.   CALCIUM-D PO Take 1 tablet by mouth 2 (two) times daily.   diclofenac sodium 1 % Gel Commonly known as:  VOLTAREN Apply 1 application topically daily as needed (pain).   enoxaparin 40 MG/0.4ML injection Commonly known as:  LOVENOX Inject 0.4 mLs (40 mg total) into the skin every 12 (twelve) hours for 14 days.   enoxaparin Kit Commonly known as:  LOVENOX 1 kit by Does not apply route once for 1 dose.   EPINEPHrine 0.15 MG/0.3ML injection Commonly known as:  EPIPEN JR Inject 0.15  mg into the muscle as needed for anaphylaxis.   hydrochlorothiazide 25 MG tablet Commonly known as:  HYDRODIURIL Take 25 mg by mouth daily. Notes to patient:  Monitor Blood Pressure Daily and keep a log for primary care physician.  Monitor for symptoms of dehydration.  You may need to make changes to your medications with rapid weight loss.     levothyroxine 125 MCG tablet Commonly known as:  SYNTHROID, LEVOTHROID Take 125 mcg by mouth every other day. Alternating between 125 mcg and 137 mcg   levothyroxine 137 MCG tablet Commonly known as:  SYNTHROID, LEVOTHROID Take 137 mcg by mouth every other day. Alternating between 125 mcg and 137 mcg   losartan 50 MG  tablet Commonly known as:  COZAAR Take 50 mg by mouth at bedtime. Notes to patient:  Monitor Blood Pressure Daily and keep a log for primary care physician.  You may need to make changes to your medications with rapid weight loss.     multivitamin with minerals Tabs tablet Take 1 tablet by mouth daily.   zolpidem 10 MG tablet Commonly known as:  AMBIEN Take 2.5 mg by mouth at bedtime as needed for sleep.      Follow-up Information    [facility] [place]. Go on 02/20/2018.   Specialty:  General Surgery Why:  at 430 Contact information: [address] [place] [address] [phone]        [facility] [place] .   Specialty:  General Surgery Contact information: [address] [place] [address] [phone]           Signed: Pedro Earls 01/24/2018, 12:14 PM

## 2018-01-24 NOTE — Progress Notes (Signed)
Patient educated to lovenox and lovenox injections. Patient understands how to administer medication and sign and symptoms to be aware of.  No questions at this time.  Lovenox teaching kit provided by bedside RN. 

## 2018-01-24 NOTE — Progress Notes (Signed)
I have reviewed and concur with students documentation.  

## 2018-01-24 NOTE — Progress Notes (Signed)
Patient alert and oriented, pain is controlled. Patient is tolerating fluids, advanced to protein shake today, patient is tolerating well. Reviewed Gastric Bypass discharge instructions with patient and patient is able to articulate understanding. Provided information on BELT program, Support Group and [facility] outpatient pharmacy. All questions answered, will continue to monitor.   Total fluid intake 900 Per dehydration protocol call back 01-29-18

## 2018-01-24 NOTE — Progress Notes (Signed)
Discharge and medication instructions (including Lovenox) reviewed with patient. Questions answered and patient denies further questions. One prescription given to patient. Daughter here to drive patient home. Donne Hazel, RN

## 2018-01-24 NOTE — Discharge Instructions (Signed)
Enoxaparin injection °What is this medicine? °ENOXAPARIN (ee nox a PA rin) is used after knee, hip, or abdominal surgeries to prevent blood clotting. It is also used to treat existing blood clots in the lungs or in the veins. °This medicine may be used for other purposes; ask your health care provider or pharmacist if you have questions. °COMMON BRAND NAME(S): Lovenox °What should I tell my health care provider before I take this medicine? °They need to know if you have any of these conditions: °-bleeding disorders, hemorrhage, or hemophilia °-infection of the heart or heart valves °-kidney or liver disease °-previous stroke °-prosthetic heart valve °-recent surgery or delivery of a baby °-ulcer in the stomach or intestine, diverticulitis, or other bowel disease °-an unusual or allergic reaction to enoxaparin, heparin, pork or pork products, other medicines, foods, dyes, or preservatives °-pregnant or trying to get pregnant °-breast-feeding °How should I use this medicine? °This medicine is for injection under the skin. It is usually given by a health-care professional. You or a family member may be trained on how to give the injections. If you are to give yourself injections, make sure you understand how to use the syringe, measure the dose if necessary, and give the injection. To avoid bruising, do not rub the site where this medicine has been injected. Do not take your medicine more often than directed. Do not stop taking except on the advice of your doctor or health care professional. °Make sure you receive a puncture-resistant container to dispose of the needles and syringes once you have finished with them. Do not reuse these items. Return the container to your doctor or health care professional for proper disposal. °Talk to your pediatrician regarding the use of this medicine in children. Special care may be needed. °Overdosage: If you think you have taken too much of this medicine contact a poison control  center or emergency room at once. °NOTE: This medicine is only for you. Do not share this medicine with others. °What if I miss a dose? °If you miss a dose, take it as soon as you can. If it is almost time for your next dose, take only that dose. Do not take double or extra doses. °What may interact with this medicine? °-aspirin and aspirin-like medicines °-certain medicines that treat or prevent blood clots °-dipyridamole °-NSAIDs, medicines for pain and inflammation, like ibuprofen or naproxen °This list may not describe all possible interactions. Give your health care provider a list of all the medicines, herbs, non-prescription drugs, or dietary supplements you use. Also tell them if you smoke, drink alcohol, or use illegal drugs. Some items may interact with your medicine. °What should I watch for while using this medicine? °Visit your doctor or health care professional for regular checks on your progress. Your condition will be monitored carefully while you are receiving this medicine. °Notify your doctor or health care professional and seek emergency treatment if you develop breathing problems; changes in vision; chest pain; severe, sudden headache; pain, swelling, warmth in the leg; trouble speaking; sudden numbness or weakness of the face, arm, or leg. These can be signs that your condition has gotten worse. °If you are going to have surgery, tell your doctor or health care professional that you are taking this medicine. °Do not stop taking this medicine without first talking to your doctor. Be sure to refill your prescription before you run out of medicine. °Avoid sports and activities that might cause injury while you are using this medicine. Severe   falls or injuries can cause unseen bleeding. Be careful when using sharp tools or knives. Consider using an electric razor. Take special care brushing or flossing your teeth. Report any injuries, bruising, or red spots on the skin to your doctor or health care  professional. °What side effects may I notice from receiving this medicine? °Side effects that you should report to your doctor or health care professional as soon as possible: °-allergic reactions like skin rash, itching or hives, swelling of the face, lips, or tongue °-feeling faint or lightheaded, falls °-signs and symptoms of bleeding such as bloody or black, tarry stools; red or dark-brown urine; spitting up blood or brown material that looks like coffee grounds; red spots on the skin; unusual bruising or bleeding from the eye, gums, or nose °Side effects that usually do not require medical attention (report to your doctor or health care professional if they continue or are bothersome): °-pain, redness, or irritation at site where injected °This list may not describe all possible side effects. Call your doctor for medical advice about side effects. You may report side effects to FDA at 1-800-FDA-1088. °Where should I keep my medicine? °Keep out of the reach of children. °Store at room temperature between 15 and 30 degrees C (59 and 86 degrees F). Do not freeze. If your injections have been specially prepared, you may need to store them in the refrigerator. Ask your pharmacist. Throw away any unused medicine after the expiration date. °NOTE: This sheet is a summary. It may not cover all possible information. If you have questions about this medicine, talk to your doctor, pharmacist, or health care provider. °© 2018 [employer]vier/Gold Standard (2013-09-16 16:06:21) ° ° ° ° °GASTRIC BYPASS/SLEEVE ° Home Care Instructions ° ° These instructions are to help you care for yourself when you go home. ° °Call: If you have any problems. °• Call [phone] and ask for the surgeon on call °• If you need immediate help, come to the ER at [facility].  °• Tell the ER staff that you are a new post-op gastric bypass or gastric sleeve patient °  °Signs and symptoms to report: • Severe vomiting or nausea °o If you cannot keep down  clear liquids for longer than 1 day, call your surgeon  °• Abdominal pain that does not get better after taking your pain medication °• Fever over 100.4° F with chills °• Heart beating over 100 beats a minute °• Shortness of breath at rest °• Chest pain °•  Redness, swelling, drainage, or foul odor at incision (surgical) sites °•  If your incisions open or pull apart °• Swelling or pain in calf (lower leg) °• Diarrhea (Loose bowel movements that happen often), frequent watery, uncontrolled bowel movements °• Constipation, (no bowel movements for 3 days) if this happens: Pick one °o Milk of Magnesia, 2 tablespoons by mouth, 3 times a day for 2 days if needed °o Stop taking Milk of Magnesia once you have a bowel movement °o Call your doctor if constipation continues °Or °o Miralax  (instead of Milk of Magnesia) following the label instructions °o Stop taking Miralax once you have a bowel movement °o Call your doctor if constipation continues °• Anything you think is not normal °  °Normal side effects after surgery: • Unable to sleep at night or unable to focus °• Irritability or moody °• Being tearful (crying) or depressed °These are common complaints, possibly related to your anesthesia medications that put you to sleep, stress of surgery,   and change in lifestyle.  This usually goes away a few weeks after surgery.  If these feelings continue, call your primary care doctor. °  °Wound Care: You may have surgical glue, steri-strips, or staples over your incisions after surgery °• Surgical glue:  Looks like a clear film over your incisions and will wear off a little at a time °• Steri-strips: Strips of tape over your incisions. You may notice a yellowish color on the skin under the steri-strips. This is used to make the   steri-strips stick better. Do not pull the steri-strips off - let them fall off °• Staples: Staples may be removed before you leave the hospital °o If you go home with staples, call [facility], [phone] at for an appointment with your surgeon’s nurse to have staples removed 10 days after surgery. °• Showering: You may shower two (2) days after your surgery unless your surgeon tells you differently °o Wash gently around incisions with warm soapy water, rinse well, and gently pat dry  °o No tub baths until staples are removed, steri-strips fall off or glue is gone.  °  °Medications: • Medications should be liquid or crushed if larger than the size of a dime °• Extended release pills (medication that release a little bit at a time through the day) should NOT be crushed or cut. (examples include XL, ER, DR, SR) °• Depending on the size and number of medications you take, you may need to space (take a few throughout the day)/change the time you take your medications so that you do not over-fill your pouch (smaller stomach) °• Make sure you follow-up with your primary care doctor to make medication changes needed during rapid weight loss and life-style changes °• If you have diabetes, follow up with the doctor that orders your diabetes medication(s) within one week after surgery and check your blood sugar regularly. °• Do not drive while taking prescription pain medication  °• It is ok to take Tylenol by the bottle instructions with your pain medicine or instead of your pain medicine as needed.  DO NOT TAKE NSAIDS (EXAMPLES OF NSAIDS:  IBUPROFREN/ NAPROXEN)  °Diet:                    First 2 Weeks ° You will see the dietician t about two (2) weeks after your surgery. The dietician will increase the types of foods you can eat if you are handling liquids well: °• If you have severe vomiting or nausea and cannot keep down clear liquids lasting longer than 1 day, call your surgeon @ ([phone]) °Protein Shake °• Drink at least 2 ounces of shake 5-6 times per day °• Each serving of protein shakes (usually 8 - 12 ounces) should have: °o 15 grams of protein  °o And no more than 5 grams of carbohydrate   °• Goal for protein each day: °o Men = 80 grams per day °o Women = 60 grams per day °• Protein powder may be added to fluids such as non-fat milk or Lactaid milk or unsweetened Soy/Almond milk (limit to 35 grams added protein powder per serving) ° °Hydration °• Slowly increase the amount of water and other clear liquids as tolerated (See Acceptable Fluids) °• Slowly increase the amount of protein shake as tolerated  °•  Sip fluids slowly and throughout the day.  Do not use straws. °• May use sugar substitutes in small amounts (no more than 6 - 8 packets per   day; i.e. Splenda) ° °Fluid Goal °• The first goal is to drink at least 8 ounces of protein shake/drink per day (or as directed by the nutritionist); some examples of protein shakes are Syntrax Nectar, Adkins Advantage, EAS Edge HP, and Unjury. See handout from pre-op Bariatric Education Class: °o Slowly increase the amount of protein shake you drink as tolerated °o You may find it easier to slowly sip shakes throughout the day °o It is important to get your proteins in first °• Your fluid goal is to drink 64 - 100 ounces of fluid daily °o It may take a few weeks to build up to this °• 32 oz (or more) should be clear liquids  °And  °• 32 oz (or more) should be full liquids (see below for examples) °• Liquids should not contain sugar, caffeine, or carbonation ° °Clear Liquids: °• Water or Sugar-free flavored water (i.e. Fruit H2O, Propel) °• Decaffeinated coffee or tea (sugar-free) °• Crystal Lite, Wyler’s Lite, Minute Maid Lite °• Sugar-free Jell-O °• Bouillon or broth °• Sugar-free Popsicle:   *Less than 20 calories each; Limit 1 per day ° °Full Liquids: °Protein Shakes/Drinks + 2 choices per day of other full liquids °• Full liquids must be: °o No More Than 15 grams of Carbs per serving  °o No More Than 3 grams of Fat per serving °• Strained low-fat cream soup (except Cream of Potato or Tomato) °• Non-Fat milk °• Fat-free Lactaid Milk °• Unsweetened Soy Or  Unsweetened Almond Milk °• Low Sugar yogurt (Dannon Lite & Fit, Greek yogurt; Oikos Triple Zero; Chobani Simply 100; Yoplait 100 calorie Greek - No Fruit on the Bottom) ° °  °Vitamins and Minerals • Start 1 day after surgery unless otherwise directed by your surgeon °• 2 Chewable Bariatric Specific Multivitamin / Multimineral Supplement with iron (Example: Bariatric Advantage Multi EA) °• Chewable Calcium with Vitamin D-3 °(Example: 3 Chewable Calcium Plus 600 with Vitamin D-3) °o Take 500 mg three (3) times a day for a total of 1500 mg each day °o Do not take all 3 doses of calcium at one time as it may cause constipation, and you can only absorb 500 mg  at a time  °o Do not mix multivitamins containing iron with calcium supplements; take 2 hours apart °• Menstruating women and those with a history of anemia (a blood disease that causes weakness) may need extra iron °o Talk with your doctor to see if you need more iron °• Do not stop taking or change any vitamins or minerals until you talk to your dietitian or surgeon °• Your Dietitian and/or surgeon must approve all vitamin and mineral supplements °  °Activity and Exercise: Limit your physical activity as instructed by your doctor.  It is important to continue walking at home.  During this time, use these guidelines: °• Do not lift anything greater than ten (10) pounds for at least two (2) weeks °• Do not go back to work or drive until your surgeon says you can °• You may have sex when you feel comfortable  °o It is VERY important for female patients to use a reliable birth control method; fertility often increases after surgery  °o All hormonal birth control will be ineffective for 30 days after surgery due to medications given during surgery a barrier method must be used. °o Do not get pregnant for at least 18 months °• Start exercising as soon as your doctor tells you that you can °o Make sure   your doctor approves any physical activity °• Start with a simple  walking program °• Walk 5-15 minutes each day, 7 days per week.  °• Slowly increase until you are walking 30-45 minutes per day °Consider joining our BELT program. [phone] or email [email or web address] °  °Special Instructions Things to remember: °• Use your CPAP when sleeping if this applies to you ° °• [facility] has two free Bariatric Surgery Support Groups that meet monthly °o The 3rd Thursday of each month, 6 pm, [facility]  °o The 2nd Friday of each month, 11:45 am in the private dining room in the basement of [facility] °• It is very important to keep all follow up appointments with your surgeon, dietitian, primary care physician, and behavioral health practitioner °• Routine follow up schedule with your surgeon include appointments at 2-3 weeks, 6-8 weeks, 6 months, and 1 year at a minimum.  Your surgeon may request to see you more often.   °o After the first year, please follow up with your bariatric surgeon and dietitian at least once a year in order to maintain best weight loss results °[facility]: [phone] °[facility]: [phone] °Bariatric Nurse Coordinator: [phone] °  °   Reviewed and Endorsed  °by  Patient Education Committee, June, 2016 °Edits Approved: Aug, 2018 ° ° ° °

## 2018-01-24 NOTE — Care Management Note (Signed)
Case Management Note  Patient Details  Name: Carol Sanchez MRN: [id number] Date of Birth: 03-09-1951  Subjective/Objective:  Has pcp,pharmacy-informed of co pay see prior note. No CM needs.                  Action/Plan:d/c home.   Expected Discharge Date:                  Expected Discharge Plan:  Home/Self Care  In-House Referral:     Discharge planning Services  CM Consult  Post Acute Care Choice:    Choice offered to:     DME Arranged:    DME Agency:     HH Arranged:    [place] Agency:     Status of Service:  Completed, signed off  If discussed at [employer] of Stay Meetings, dates discussed:    Additional Comments:  Dessa Phi, RN 01/24/2018, 12:06 PM

## 2018-01-26 LAB — TYPE AND SCREEN
ABO/RH(D): A POS
Antibody Screen: POSITIVE
DONOR AG TYPE: NEGATIVE
DONOR AG TYPE: NEGATIVE
Unit division: 0
Unit division: 0

## 2018-01-26 LAB — BPAM RBC
Blood Product Expiration Date: 201909232359
Blood Product Expiration Date: 201909232359
UNIT TYPE AND RH: 6200
UNIT TYPE AND RH: 6200

## 2018-01-29 ENCOUNTER — Telehealth (HOSPITAL_COMMUNITY): Payer: Self-pay

## 2018-01-29 NOTE — Telephone Encounter (Signed)
Patient called to discuss post bariatric surgery follow up questions.  See below:   1.  Tell me about your pain and pain management?no pain medication the last 3 days  2.  Let's talk about fluid intake.  How much total fluid are you taking in?48 ounces  3.  How much protein have you taken in the last 2 days?60 grams protein  4.  Have you had nausea?  Tell me about when have experienced nausea and what you did to help?no nausea  5.  Has the frequency or color changed with your urine?urine light in color  6.  Tell me what your incisions look like?no problems  7.  Have you been passing gas? BM?having bms not problem   8.  If a problem or question were to arise who would you call?  Do you know contact numbers for [place], [facility], and [facility]?aware of all services  9.  How has the walking going?walking alot  10.  How are your vitamins and calcium going?  How are you taking them?mvi and calcium following a schedule

## 2018-02-05 ENCOUNTER — Encounter: Payer: Medicare Other | Attending: Surgery | Admitting: Registered"

## 2018-02-05 DIAGNOSIS — I10 Essential (primary) hypertension: Secondary | ICD-10-CM | POA: Insufficient documentation

## 2018-02-05 DIAGNOSIS — E7800 Pure hypercholesterolemia, unspecified: Secondary | ICD-10-CM | POA: Insufficient documentation

## 2018-02-05 DIAGNOSIS — M549 Dorsalgia, unspecified: Secondary | ICD-10-CM | POA: Diagnosis not present

## 2018-02-05 DIAGNOSIS — E890 Postprocedural hypothyroidism: Secondary | ICD-10-CM | POA: Diagnosis not present

## 2018-02-05 DIAGNOSIS — E669 Obesity, unspecified: Secondary | ICD-10-CM

## 2018-02-05 DIAGNOSIS — Z882 Allergy status to sulfonamides status: Secondary | ICD-10-CM | POA: Diagnosis not present

## 2018-02-05 DIAGNOSIS — Z87442 Personal history of urinary calculi: Secondary | ICD-10-CM | POA: Diagnosis not present

## 2018-02-05 DIAGNOSIS — Z8261 Family history of arthritis: Secondary | ICD-10-CM | POA: Insufficient documentation

## 2018-02-05 DIAGNOSIS — Z880 Allergy status to penicillin: Secondary | ICD-10-CM | POA: Diagnosis not present

## 2018-02-05 DIAGNOSIS — Z85850 Personal history of malignant neoplasm of thyroid: Secondary | ICD-10-CM | POA: Insufficient documentation

## 2018-02-05 DIAGNOSIS — Z8249 Family history of ischemic heart disease and other diseases of the circulatory system: Secondary | ICD-10-CM | POA: Insufficient documentation

## 2018-02-05 DIAGNOSIS — Z6841 Body Mass Index (BMI) 40.0 and over, adult: Secondary | ICD-10-CM | POA: Insufficient documentation

## 2018-02-05 DIAGNOSIS — Z833 Family history of diabetes mellitus: Secondary | ICD-10-CM | POA: Diagnosis not present

## 2018-02-05 DIAGNOSIS — Z713 Dietary counseling and surveillance: Secondary | ICD-10-CM | POA: Diagnosis not present

## 2018-02-06 NOTE — Progress Notes (Signed)
Bariatric Class:  Appt start time: 1530 end time:  1630.  2 Week Post-Operative Nutrition Class  Patient was seen on 02/05/2018 for Post-[facility]ative [facility]   Surgery date: 01/22/2018 Surgery type: RYGB Start weight at [facility]: 240.6 Weight today: 228.4 Weight change: 12.2 lbs loss  Pt states she consumes at least 48 oz of fluid a day and 60 grams of protein. Pt states she had a hiatal hernia therefore getting the RYGB instead of sleeve gastrectomy as originally planned. Pt states she feels great.  TANITA  BODY COMP RESULTS  02/05/2018   BMI (kg/m^2) 44.6   Fat Mass (lbs) 119.2   Fat Free Mass (lbs) 109.2   Total Body Water (lbs) 78.8   The following the learning objectives were met by the patient during this course:  Identifies Phase 3A (Soft, High Proteins) Dietary Goals and will begin from 2 weeks post-operatively to 2 months post-operatively  Identifies appropriate sources of fluids and proteins   States protein recommendations and appropriate sources post-operatively  Identifies the need for appropriate texture modifications, mastication, and bite sizes when consuming solids  Identifies appropriate multivitamin and calcium sources post-operatively  Describes the need for physical activity post-operatively and will follow MD recommendations  States when to call healthcare provider regarding medication questions or post-operative complications  Handouts given during class include:  Phase 3A: Soft, High Protein Diet Handout  Follow-Up Plan: Patient will follow-up at [facility] in 6 weeks for 2 month post-op nutrition visit for diet advancement per MD.

## 2018-02-14 ENCOUNTER — Telehealth: Payer: Self-pay | Admitting: Registered"

## 2018-02-14 NOTE — Telephone Encounter (Signed)
RD called pt to verify fluid intake once starting soft, solid proteins 2 week post-bariatric surgery. Pt unavailable. RD left voicemail.

## 2018-02-15 DIAGNOSIS — Z9884 Bariatric surgery status: Secondary | ICD-10-CM | POA: Diagnosis not present

## 2018-02-15 DIAGNOSIS — Z85850 Personal history of malignant neoplasm of thyroid: Secondary | ICD-10-CM | POA: Diagnosis not present

## 2018-02-15 DIAGNOSIS — Z23 Encounter for immunization: Secondary | ICD-10-CM | POA: Diagnosis not present

## 2018-02-15 DIAGNOSIS — Z79899 Other long term (current) drug therapy: Secondary | ICD-10-CM | POA: Diagnosis not present

## 2018-02-15 DIAGNOSIS — R7309 Other abnormal glucose: Secondary | ICD-10-CM | POA: Diagnosis not present

## 2018-02-15 DIAGNOSIS — E611 Iron deficiency: Secondary | ICD-10-CM | POA: Diagnosis not present

## 2018-02-15 DIAGNOSIS — E890 Postprocedural hypothyroidism: Secondary | ICD-10-CM | POA: Diagnosis not present

## 2018-02-15 DIAGNOSIS — E559 Vitamin D deficiency, unspecified: Secondary | ICD-10-CM | POA: Diagnosis not present

## 2018-02-28 DIAGNOSIS — M79659 Pain in unspecified thigh: Secondary | ICD-10-CM | POA: Diagnosis not present

## 2018-02-28 DIAGNOSIS — M79651 Pain in right thigh: Secondary | ICD-10-CM | POA: Diagnosis not present

## 2018-03-22 ENCOUNTER — Encounter: Payer: Self-pay | Admitting: Registered"

## 2018-03-22 ENCOUNTER — Encounter: Payer: Medicare Other | Attending: Surgery | Admitting: Registered"

## 2018-03-22 DIAGNOSIS — Z713 Dietary counseling and surveillance: Secondary | ICD-10-CM | POA: Diagnosis not present

## 2018-03-22 DIAGNOSIS — Z85850 Personal history of malignant neoplasm of thyroid: Secondary | ICD-10-CM | POA: Insufficient documentation

## 2018-03-22 DIAGNOSIS — Z880 Allergy status to penicillin: Secondary | ICD-10-CM | POA: Diagnosis not present

## 2018-03-22 DIAGNOSIS — Z8249 Family history of ischemic heart disease and other diseases of the circulatory system: Secondary | ICD-10-CM | POA: Insufficient documentation

## 2018-03-22 DIAGNOSIS — Z6841 Body Mass Index (BMI) 40.0 and over, adult: Secondary | ICD-10-CM | POA: Insufficient documentation

## 2018-03-22 DIAGNOSIS — I10 Essential (primary) hypertension: Secondary | ICD-10-CM | POA: Insufficient documentation

## 2018-03-22 DIAGNOSIS — Z833 Family history of diabetes mellitus: Secondary | ICD-10-CM | POA: Insufficient documentation

## 2018-03-22 DIAGNOSIS — Z882 Allergy status to sulfonamides status: Secondary | ICD-10-CM | POA: Insufficient documentation

## 2018-03-22 DIAGNOSIS — E890 Postprocedural hypothyroidism: Secondary | ICD-10-CM | POA: Insufficient documentation

## 2018-03-22 DIAGNOSIS — E7800 Pure hypercholesterolemia, unspecified: Secondary | ICD-10-CM | POA: Insufficient documentation

## 2018-03-22 DIAGNOSIS — Z87442 Personal history of urinary calculi: Secondary | ICD-10-CM | POA: Diagnosis not present

## 2018-03-22 DIAGNOSIS — Z8261 Family history of arthritis: Secondary | ICD-10-CM | POA: Diagnosis not present

## 2018-03-22 DIAGNOSIS — M549 Dorsalgia, unspecified: Secondary | ICD-10-CM | POA: Diagnosis not present

## 2018-03-22 DIAGNOSIS — E669 Obesity, unspecified: Secondary | ICD-10-CM

## 2018-03-22 NOTE — Patient Instructions (Addendum)
Goals:  Follow Phase IV: High Protein + Non-Starchy Vegetables  Eat 3-6 small meals/snacks, every 3-5 hrs  Increase lean protein foods to meet 60g goal  Increase fluid intake to 64oz +  Avoid drinking 15 minutes before, during and 30 minutes after eating  Aim for >30 min of physical activity daily

## 2018-03-22 NOTE — Progress Notes (Signed)
Follow-up visit:  8 Weeks Post-Operative RYGB Surgery  Medical Nutrition Therapy:  Appt start time: 8:05 end time:  8:45.  Primary concerns today: Post-operative Bariatric Surgery Nutrition Management.  Non scale victories: wearing smaller sizes, no longer taking metformin  Surgery date: 01/22/2018 Surgery type: RYGB Start weight at [facility]: 240.6 Weight today: 226.0 Weight change: 2.4 lbs loss from 228.4 (02/05/2018) Total weight lost: 14.6 lbs Weight loss goal: ~150-160 lbs, get healthier, improve knee pain, increase tolerance on bike    Pt states she had a hiatal hernia therefore getting the RYGB instead of sleeve gastrectomy as originally planned. Pt states she feels great.  TANITA  BODY COMP RESULTS  02/05/2018 03/22/2018   BMI (kg/m^2) 44.6 45.6   Fat Mass (lbs) 119.2 115.8   Fat Free Mass (lbs) 109.2 110.2   Total Body Water (lbs) 78.8 79.6   Pt states she vomited yesterday with chicken salad. Pt states she does not tolerate meat well; sticking  to seafood. Pt states she lives alone and eats alone, which makes it hard for her to eat slow. Pt states she eats better when with friends. Pt states she has no sensation of fullness. Pt states she is working on mindless eating, finds herself eating while working. Pt states she hates her job and planning to transition to part-time around Mar 1. Pt states recently bought an air fryer and loves it.   Preferred Learning Style:   No preference indicated   Learning Readiness:   Ready  Change in progress  24-hr recall: B (AM): egg (6g) + cheese (7g) + canadian bacon (7g) Snk (AM): greek yogurt (15g) L (PM): 3 oz tuna fish (21g) Snk (PM): sometimes sugar-free pudding  D (PM): 3 oz air fried chicken thigh (21g) Snk (PM): none  Fluid intake: coffee, water; 64+ oz Estimated total protein intake: 60+ grams  Medications: See list; no longer taking metformin, decreased dosage of synthroid and  Supplementation: Celebrate + 3 calcium  supplements  Using straws: no Drinking while eating: no Having you been chewing well: most times Chewing/swallowing difficulties: no Changes in vision: no Changes to mood/headaches: no Hair loss/Changes to skin/Changes to nails: no, no, no Any difficulty focusing or concentrating: no Sweating: no Dizziness/Lightheaded: no Palpitations: no  Carbonated beverages: no N/V/D/C/GAS: sometimes, sometimes, no, yes-taking Miralax, a little Abdominal Pain: no Dumping syndrome: no Last Lap-Band fill: N/A  Recent physical activity: Biking 30 min, 3x/week; in the process of getting connected with BELT  Progress Towards Goal(s):  In progress.  Handouts given during visit include:  Phase IV: High Protein + NS vegetables   Nutritional Diagnosis:  [place]-3.3 Overweight/obesity related to past poor dietary habits and physical inactivity as evidenced by patient w/ recent RYGB surgery following dietary guidelines for continued weight loss.     Intervention:  Nutrition education and counseling. Pt was educated and counseled on the next phase of the diet.  Goals:  Follow Phase IV: High Protein + Non-Starchy Vegetables  Eat 3-6 small meals/snacks, every 3-5 hrs  Increase lean protein foods to meet 60g goal  Increase fluid intake to 64oz +  Avoid drinking 15 minutes before, during and 30 minutes after eating  Aim for >30 min of physical activity daily   Teaching Method Utilized:  Visual Auditory Hands on  Barriers to learning/adherence to lifestyle change: work-life balance  Demonstrated degree of understanding via:  Teach Back   Monitoring/Evaluation:  Dietary intake, exercise, lap band fills, and body weight. Follow up in 1 months  for 3 month post-op visit.

## 2018-03-26 DIAGNOSIS — M79671 Pain in right foot: Secondary | ICD-10-CM | POA: Diagnosis not present

## 2018-03-26 DIAGNOSIS — L600 Ingrowing nail: Secondary | ICD-10-CM | POA: Diagnosis not present

## 2018-04-10 DIAGNOSIS — M25561 Pain in right knee: Secondary | ICD-10-CM | POA: Diagnosis not present

## 2018-04-10 DIAGNOSIS — M1711 Unilateral primary osteoarthritis, right knee: Secondary | ICD-10-CM | POA: Diagnosis not present

## 2018-04-12 ENCOUNTER — Encounter: Payer: Self-pay | Admitting: Registered"

## 2018-04-12 ENCOUNTER — Encounter: Payer: Medicare Other | Attending: Surgery | Admitting: Registered"

## 2018-04-12 DIAGNOSIS — I10 Essential (primary) hypertension: Secondary | ICD-10-CM | POA: Insufficient documentation

## 2018-04-12 DIAGNOSIS — Z85850 Personal history of malignant neoplasm of thyroid: Secondary | ICD-10-CM | POA: Insufficient documentation

## 2018-04-12 DIAGNOSIS — Z8261 Family history of arthritis: Secondary | ICD-10-CM | POA: Insufficient documentation

## 2018-04-12 DIAGNOSIS — Z880 Allergy status to penicillin: Secondary | ICD-10-CM | POA: Diagnosis not present

## 2018-04-12 DIAGNOSIS — Z713 Dietary counseling and surveillance: Secondary | ICD-10-CM | POA: Diagnosis not present

## 2018-04-12 DIAGNOSIS — E7800 Pure hypercholesterolemia, unspecified: Secondary | ICD-10-CM | POA: Insufficient documentation

## 2018-04-12 DIAGNOSIS — E890 Postprocedural hypothyroidism: Secondary | ICD-10-CM | POA: Diagnosis not present

## 2018-04-12 DIAGNOSIS — Z833 Family history of diabetes mellitus: Secondary | ICD-10-CM | POA: Diagnosis not present

## 2018-04-12 DIAGNOSIS — Z882 Allergy status to sulfonamides status: Secondary | ICD-10-CM | POA: Diagnosis not present

## 2018-04-12 DIAGNOSIS — M549 Dorsalgia, unspecified: Secondary | ICD-10-CM | POA: Diagnosis not present

## 2018-04-12 DIAGNOSIS — Z6841 Body Mass Index (BMI) 40.0 and over, adult: Secondary | ICD-10-CM | POA: Insufficient documentation

## 2018-04-12 DIAGNOSIS — Z8249 Family history of ischemic heart disease and other diseases of the circulatory system: Secondary | ICD-10-CM | POA: Insufficient documentation

## 2018-04-12 DIAGNOSIS — Z87442 Personal history of urinary calculi: Secondary | ICD-10-CM | POA: Insufficient documentation

## 2018-04-12 DIAGNOSIS — E669 Obesity, unspecified: Secondary | ICD-10-CM

## 2018-04-12 NOTE — Patient Instructions (Signed)
-   Keep up the great work!  - Continue to mindfully eat meals.

## 2018-04-12 NOTE — Progress Notes (Signed)
Follow-up visit:  3 Months Post-Operative RYGB Surgery  Medical Nutrition Therapy:  Appt start time: 8:05 end time:  8:35.  Primary concerns today: Post-operative Bariatric Surgery Nutrition Management.  Non scale victories: wearing smaller sizes, no longer taking metformin  Surgery date: 01/22/2018 Surgery type: RYGB Start weight at [facility]: 240.6 Weight today: 222.4 Weight change: 3.6 lbs loss from 226.0 (03/22/2018) Total weight lost: 18.2 lbs Weight loss goal: ~150-160 lbs, get healthier, improve knee pain, increase tolerance on bike    Pt states she had a hiatal hernia therefore had the RYGB instead of sleeve gastrectomy as originally planned.   TANITA  BODY COMP RESULTS  02/05/2018 03/22/2018 04/12/2018   BMI (kg/m^2) 44.6 45.6 44.9   Fat Mass (lbs) 119.2 115.8 115.8   Fat Free Mass (lbs) 109.2 110.2 106.6   Total Body Water (lbs) 78.8 79.6 76.8    Pt states she will be unemployed as of 04/17/2018 and looking forward to it. Pt states this will give her more free time to workout and will also be less stressed. Pt states she craved sweets since surgery. Pt states vanilla birthday cake used to be her favorite. Pt states she wants to stay away from triggers. Pt states she started attending [employer] this week and loves it.   Pt states she does not tolerate chicken well; sticking. Pt states she lives alone and eats alone, which makes it hard for her to eat slow. Pt states she eats better when with friends. Pt states she has no sensation of fullness. Pt states recently bought an air fryer and loves it.   Preferred Learning Style:   No preference indicated   Learning Readiness:   Ready  Change in progress  24-hr recall: B (AM): egg (6g) + cheese (7g) + canadian bacon (7g) or protein shake (30g) Snk (AM): greek yogurt (15g) L (PM): 3 oz tuna fish (21g) Snk (PM): sometimes sugar-free pudding  D (PM): 3 oz air fried chicken thigh (21g) Snk (PM): none  Fluid intake: coffee, water;  64+ oz Estimated total protein intake: 60+ grams  Medications: See list; no longer taking metformin, decreased dosage of synthroid and  Supplementation: Celebrate + 3 calcium supplements  Using straws: no Drinking while eating: no Having you been chewing well: most times Chewing/swallowing difficulties: no Changes in vision: no Changes to mood/headaches: no Hair loss/Changes to skin/Changes to nails: no, no, no Any difficulty focusing or concentrating: no Sweating: no Dizziness/Lightheaded: no Palpitations: no  Carbonated beverages: no N/V/D/C/GAS: sometimes, sometimes, no, yes-taking Miralax, a little Abdominal Pain: no Dumping syndrome: no Last Lap-Band fill: N/A  Recent physical activity: [employer] 60 min, 3 days/week  Progress Towards Goal(s):  In progress.  Handouts given during visit include:  Phase IV: High Protein + NS vegetables   Nutritional Diagnosis:  [place]-3.3 Overweight/obesity related to past poor dietary habits and physical inactivity as evidenced by patient w/ recent RYGB surgery following dietary guidelines for continued weight loss.     Intervention:  Nutrition education and counseling.Pt was encouraged to continue with the great changes she has made.  Goals: - Keep up the great work! - Continue to mindfully eat meals.    Teaching Method Utilized:  Visual Auditory Hands on  Barriers to learning/adherence to lifestyle change: work-life balance  Demonstrated degree of understanding via:  Teach Back   Monitoring/Evaluation:  Dietary intake, exercise, and body weight. Follow up in 1 months for 4 month post-op visit.

## 2018-04-16 DIAGNOSIS — E890 Postprocedural hypothyroidism: Secondary | ICD-10-CM | POA: Diagnosis not present

## 2018-04-22 DIAGNOSIS — M25561 Pain in right knee: Secondary | ICD-10-CM | POA: Diagnosis not present

## 2018-05-06 DIAGNOSIS — M1711 Unilateral primary osteoarthritis, right knee: Secondary | ICD-10-CM | POA: Diagnosis not present

## 2018-05-06 DIAGNOSIS — M25561 Pain in right knee: Secondary | ICD-10-CM | POA: Diagnosis not present

## 2018-05-10 ENCOUNTER — Other Ambulatory Visit: Payer: Self-pay | Admitting: Internal Medicine

## 2018-05-10 DIAGNOSIS — Z1231 Encounter for screening mammogram for malignant neoplasm of breast: Secondary | ICD-10-CM

## 2018-05-13 ENCOUNTER — Encounter: Payer: Self-pay | Admitting: Dietician

## 2018-05-13 ENCOUNTER — Encounter: Payer: Medicare Other | Attending: Surgery | Admitting: Dietician

## 2018-05-13 DIAGNOSIS — Z6841 Body Mass Index (BMI) 40.0 and over, adult: Secondary | ICD-10-CM | POA: Diagnosis not present

## 2018-05-13 DIAGNOSIS — Z8261 Family history of arthritis: Secondary | ICD-10-CM | POA: Insufficient documentation

## 2018-05-13 DIAGNOSIS — Z833 Family history of diabetes mellitus: Secondary | ICD-10-CM | POA: Diagnosis not present

## 2018-05-13 DIAGNOSIS — Z85850 Personal history of malignant neoplasm of thyroid: Secondary | ICD-10-CM | POA: Insufficient documentation

## 2018-05-13 DIAGNOSIS — E890 Postprocedural hypothyroidism: Secondary | ICD-10-CM | POA: Insufficient documentation

## 2018-05-13 DIAGNOSIS — Z87442 Personal history of urinary calculi: Secondary | ICD-10-CM | POA: Diagnosis not present

## 2018-05-13 DIAGNOSIS — Z880 Allergy status to penicillin: Secondary | ICD-10-CM | POA: Insufficient documentation

## 2018-05-13 DIAGNOSIS — I10 Essential (primary) hypertension: Secondary | ICD-10-CM | POA: Insufficient documentation

## 2018-05-13 DIAGNOSIS — M549 Dorsalgia, unspecified: Secondary | ICD-10-CM | POA: Diagnosis not present

## 2018-05-13 DIAGNOSIS — Z713 Dietary counseling and surveillance: Secondary | ICD-10-CM | POA: Insufficient documentation

## 2018-05-13 DIAGNOSIS — E7800 Pure hypercholesterolemia, unspecified: Secondary | ICD-10-CM | POA: Diagnosis not present

## 2018-05-13 DIAGNOSIS — Z8249 Family history of ischemic heart disease and other diseases of the circulatory system: Secondary | ICD-10-CM | POA: Insufficient documentation

## 2018-05-13 DIAGNOSIS — E669 Obesity, unspecified: Secondary | ICD-10-CM

## 2018-05-13 DIAGNOSIS — Z882 Allergy status to sulfonamides status: Secondary | ICD-10-CM | POA: Diagnosis not present

## 2018-05-13 NOTE — Progress Notes (Signed)
Bariatric Follow-Up Visit 4 Months Post-Operative RYGB Surgery Medical Nutrition Therapy  Appt Start Time: 2:10pm End Time: 2:30pm  Pt was seen on 05/13/2018 for Post-Operative Bariatric Surgery Nutrition Management.  Surgery Date: 01/22/2018   NUTRITION ASSESSMENT  Anthropometrics  Start weight at [facility]: 240.6 lbs (Date: 05/28/2017) Today's weight: 220.4 lbs Weight change: -2 lbs (since last visit on 04/12/2018) Total weight lost: 20.2 lbs Weight loss goal: ~150-160 lbs, get healthier, improve knee pain, increase tolerance on bike   Pt states she had a hiatal hernia therefore had the RYGB instead of Sleeve Gastrectomy as originally planned.   Pt states she had an issue with stomach pain last week and thinks it is r/t dehydration, noting her beverage intake was less than usual. Pt states this is uncommon, as she has otherwise kept up very well with her water intake.   TANITA  Body Composition Results  02/05/2018 03/22/2018 04/12/2018 05/13/2018   BMI (kg/m2) 44.4 45.6 44.9 43   Fat Mass (lbs) 119.2 115.8 115.8 96.4   Fat Free Mass (lbs) 109.2 110.2 106.6 124   Total Body Water (lbs) 78.8 79.6 76.8 89   Clinical Medications: see list; no longer taking metformin; decreased dosage of synthroid; expects to come off of blood pressure medication soon  Supplements: bariatric MVI + 3 calcium daily   24-Hr Dietary Recall First Meal: protein shake (30g) Snack: none  Second Meal: tuna (21g) or egg (6g) + tomato  Snack: cottage cheese or yogurt (15g)   Third Meal: fish (21g) or egg (6g) or hamburger + vegetable Snack: none Beverages: coffee, water + Crystal Light  Food & Nutrition Related Hx Dietary Hx: Pt states meat (especially chicken) has not set well, but fish is okay. Pt states eggs are a staple in her diet. Pt states she is learning which foods do and do not "sit well" with her. Pt states she no longer craves sweets and does not miss them. Pt states she is learning how to better  assess feelings of satiety as well as working on eating slowly. Pt states she lives alone and eats alone, which makes it hard for her to eat slow. Pt states she plans to travel for the upcoming holidays and is looking forward to spending time with family, and not worried about not being able to eat as much/certain foods.  Estimated Daily Fluid Intake: 64+ oz Estimated Daily Protein Intake: 60+ g  Physical Activity  Current average weekly physical activity: BELT 60 minutes 3 days/week + peloton bike on off days   Post-Op Goals Drinking while eating: no Chewing/swallowing difficulties: no Changes in vision: no Changes to mood/headaches: no Hair loss/changes to skin/nails: no Difficulty focusing/concentrating: no Sweating: no Dizziness/lightheadedness: some lightheadedness, thinks dt BP medicine  Palpitations: no  Carbonated/caffeinated beverages: no carbonation, 1 cup coffee/day  N/V/D/C/Gas: taking Miralax for constipation  Abdominal pain: no  Dumping syndrome: no Last Lap-Band fill: N/A  *Progress Towards Goals: Pt states she is very satisfied with her progress. Pt is meeting goals and having very little issues aside from constipation. Pt states she has rid of 2/3 of her wardrobe because her clothes are too big. Pt states she still weighs herself daily and is trying to become less "scale obsessed." Pt attends BELT weekly and states she loves it.    NUTRITION DIAGNOSIS  Overweight/obesity ([place]-3.3) related to past poor dietary habits and physical inactivity as evidenced by patient w/ completed RYGB surgery following dietary guidelines for continued weight loss.   NUTRITION INTERVENTION  Nutrition counseling (C-1) and education (E-2) to facilitate bariatric surgery goals, including: . Continue current diet (phase IV) including protein and non-starchy vegetables. . The importance of consuming adequate calories as well as certain nutrients daily due to the body's need for essential  vitamins, minerals, and fats . The importance of daily physical activity and to reach a goal of at least 150 minutes of moderate to vigorous physical activity weekly (or as directed by their physician) due to benefits such as increased musculature and improved lab values  Handouts Provided Include   N/A  Learning Style & Readiness for Change Teaching method utilized: Visual & Auditory  Demonstrated degree of understanding via: Teach Back  Barriers to learning/adherence to lifestyle change: None Identified   MONITORING & EVALUATION Dietary intake, weekly physical activity, body weight in 2 months.   Next Steps Patient is to follow-up in 2 months for 6 Month Post-Op Class.

## 2018-06-07 DIAGNOSIS — H2513 Age-related nuclear cataract, bilateral: Secondary | ICD-10-CM | POA: Diagnosis not present

## 2018-06-07 DIAGNOSIS — H3509 Other intraretinal microvascular abnormalities: Secondary | ICD-10-CM | POA: Diagnosis not present

## 2018-06-07 DIAGNOSIS — H25013 Cortical age-related cataract, bilateral: Secondary | ICD-10-CM | POA: Diagnosis not present

## 2018-06-07 DIAGNOSIS — H35033 Hypertensive retinopathy, bilateral: Secondary | ICD-10-CM | POA: Diagnosis not present

## 2018-06-12 DIAGNOSIS — M25561 Pain in right knee: Secondary | ICD-10-CM | POA: Diagnosis not present

## 2018-06-18 ENCOUNTER — Ambulatory Visit
Admission: RE | Admit: 2018-06-18 | Discharge: 2018-06-18 | Disposition: A | Discharge status: Home / Self Care | Payer: Medicare Other | Source: Ambulatory Visit | Attending: Internal Medicine | Admitting: Internal Medicine

## 2018-06-18 DIAGNOSIS — Z1231 Encounter for screening mammogram for malignant neoplasm of breast: Secondary | ICD-10-CM | POA: Diagnosis not present

## 2018-07-09 ENCOUNTER — Encounter: Payer: Medicare Other | Attending: Surgery | Admitting: Skilled Nursing Facility1

## 2018-07-09 DIAGNOSIS — Z6841 Body Mass Index (BMI) 40.0 and over, adult: Secondary | ICD-10-CM | POA: Insufficient documentation

## 2018-07-09 DIAGNOSIS — Z713 Dietary counseling and surveillance: Secondary | ICD-10-CM | POA: Insufficient documentation

## 2018-07-09 DIAGNOSIS — E7800 Pure hypercholesterolemia, unspecified: Secondary | ICD-10-CM | POA: Insufficient documentation

## 2018-07-09 DIAGNOSIS — Z8261 Family history of arthritis: Secondary | ICD-10-CM | POA: Insufficient documentation

## 2018-07-09 DIAGNOSIS — Z833 Family history of diabetes mellitus: Secondary | ICD-10-CM | POA: Diagnosis not present

## 2018-07-09 DIAGNOSIS — Z87442 Personal history of urinary calculi: Secondary | ICD-10-CM | POA: Insufficient documentation

## 2018-07-09 DIAGNOSIS — I10 Essential (primary) hypertension: Secondary | ICD-10-CM | POA: Insufficient documentation

## 2018-07-09 DIAGNOSIS — Z8249 Family history of ischemic heart disease and other diseases of the circulatory system: Secondary | ICD-10-CM | POA: Insufficient documentation

## 2018-07-09 DIAGNOSIS — M549 Dorsalgia, unspecified: Secondary | ICD-10-CM | POA: Diagnosis not present

## 2018-07-09 DIAGNOSIS — Z85850 Personal history of malignant neoplasm of thyroid: Secondary | ICD-10-CM | POA: Diagnosis not present

## 2018-07-09 DIAGNOSIS — Z882 Allergy status to sulfonamides status: Secondary | ICD-10-CM | POA: Insufficient documentation

## 2018-07-09 DIAGNOSIS — Z880 Allergy status to penicillin: Secondary | ICD-10-CM | POA: Diagnosis not present

## 2018-07-09 DIAGNOSIS — E890 Postprocedural hypothyroidism: Secondary | ICD-10-CM | POA: Diagnosis not present

## 2018-07-10 NOTE — Progress Notes (Signed)
Follow-up visit:  Post-Operative RYGB Surgery  Medical Nutrition Therapy:  Appt start time: 6:00pm end time:  7:00pm  Primary concerns today: Post-operative Bariatric Surgery Nutrition Management 6 Month Post-Op Class  Start weight at [facility]: 240.6 lbs (Date: 05/28/2017) Today's weight: 212.6 lbs Weight change: 7.8 Weight loss goal: ~150-160 lbs, get healthier, improve knee pain, increase tolerance on bike   TANITA  BODY COMP RESULTS  07/09/2018   BMI (kg/m^2) 41.5   Fat Mass (lbs) 107.4   Fat Free Mass (lbs) 105.2   Total Body Water (lbs) 75.4    Information Reviewed/ Discussed During Appointment: -Review of composition scale numbers -Fluid requirements (64-100 ounces) -Protein requirements (60-80g) -Strategies for tolerating diet -Advancement of diet to include Starchy vegetables -Barriers to inclusion of new foods -Inclusion of appropriate multivitamin and calcium supplements  -Exercise recommendations   Fluid intake: adequate   Medications: See List Supplementation: appropriate   Using straws: no Drinking while eating: no Having you been chewing well: yes Chewing/swallowing difficulties: no Changes in vision: no Changes to mood/headaches: no Hair loss/Cahnges to skin/Changes to nails: no Any difficulty focusing or concentrating: no Sweating: no Dizziness/Lightheaded: no Palpitations: no  Carbonated beverages: no N/V/D/C/GAS: no Abdominal Pain: no Dumping syndrome: no  Recent physical activity:  ADL's  Progress Towards Goal(s):  In Progress  Handouts given during visit include:  Phase V diet Progression   Goals Sheet  The Benefits of Exercise are endless.....  Support Group Topics  Pt Chosen Goals: Nutrition Goals:  I will eat 3 meals a day 5 days a week by (specific date) __may 2020___________ I will drink 32 ounces of plain water 7 days a week by (specific date) ___may 2020_____________ Teaching Method Utilized:  Visual Auditory Hands  on  Demonstrated degree of understanding via:  Teach Back   Monitoring/Evaluation:  Dietary intake, exercise, and body weight. Follow up in 3 months for 9 month post-op visit.

## 2018-08-05 DIAGNOSIS — I10 Essential (primary) hypertension: Secondary | ICD-10-CM | POA: Diagnosis not present

## 2018-08-05 DIAGNOSIS — E785 Hyperlipidemia, unspecified: Secondary | ICD-10-CM | POA: Diagnosis not present

## 2018-08-07 DIAGNOSIS — Z0000 Encounter for general adult medical examination without abnormal findings: Secondary | ICD-10-CM | POA: Diagnosis not present

## 2018-08-07 DIAGNOSIS — Z23 Encounter for immunization: Secondary | ICD-10-CM | POA: Diagnosis not present

## 2018-08-07 DIAGNOSIS — G4700 Insomnia, unspecified: Secondary | ICD-10-CM | POA: Diagnosis not present

## 2018-08-07 DIAGNOSIS — E039 Hypothyroidism, unspecified: Secondary | ICD-10-CM | POA: Diagnosis not present

## 2018-08-07 DIAGNOSIS — I10 Essential (primary) hypertension: Secondary | ICD-10-CM | POA: Diagnosis not present

## 2018-08-13 DIAGNOSIS — E890 Postprocedural hypothyroidism: Secondary | ICD-10-CM | POA: Diagnosis not present

## 2018-11-01 DIAGNOSIS — J0101 Acute recurrent maxillary sinusitis: Secondary | ICD-10-CM | POA: Diagnosis not present

## 2018-11-01 DIAGNOSIS — H8111 Benign paroxysmal vertigo, right ear: Secondary | ICD-10-CM | POA: Diagnosis not present

## 2018-12-25 ENCOUNTER — Ambulatory Visit: Payer: Medicare Other | Admitting: Skilled Nursing Facility1

## 2019-01-24 DIAGNOSIS — Z9884 Bariatric surgery status: Secondary | ICD-10-CM | POA: Diagnosis not present

## 2019-02-10 DIAGNOSIS — R7309 Other abnormal glucose: Secondary | ICD-10-CM | POA: Diagnosis not present

## 2019-02-10 DIAGNOSIS — Z23 Encounter for immunization: Secondary | ICD-10-CM | POA: Diagnosis not present

## 2019-02-10 DIAGNOSIS — E890 Postprocedural hypothyroidism: Secondary | ICD-10-CM | POA: Diagnosis not present

## 2019-02-10 DIAGNOSIS — E559 Vitamin D deficiency, unspecified: Secondary | ICD-10-CM | POA: Diagnosis not present

## 2019-02-10 DIAGNOSIS — Z85850 Personal history of malignant neoplasm of thyroid: Secondary | ICD-10-CM | POA: Diagnosis not present

## 2019-02-10 DIAGNOSIS — Z9884 Bariatric surgery status: Secondary | ICD-10-CM | POA: Diagnosis not present

## 2019-02-10 DIAGNOSIS — Z79899 Other long term (current) drug therapy: Secondary | ICD-10-CM | POA: Diagnosis not present

## 2019-02-10 DIAGNOSIS — E611 Iron deficiency: Secondary | ICD-10-CM | POA: Diagnosis not present

## 2019-04-11 DIAGNOSIS — Z20828 Contact with and (suspected) exposure to other viral communicable diseases: Secondary | ICD-10-CM | POA: Diagnosis not present

## 2019-04-19 DIAGNOSIS — Z119 Encounter for screening for infectious and parasitic diseases, unspecified: Secondary | ICD-10-CM | POA: Diagnosis not present

## 2019-06-05 DIAGNOSIS — Z119 Encounter for screening for infectious and parasitic diseases, unspecified: Secondary | ICD-10-CM | POA: Diagnosis not present

## 2019-07-24 ENCOUNTER — Other Ambulatory Visit: Payer: Self-pay | Admitting: Registered Nurse

## 2019-07-24 DIAGNOSIS — Z1231 Encounter for screening mammogram for malignant neoplasm of breast: Secondary | ICD-10-CM

## 2019-08-22 ENCOUNTER — Ambulatory Visit: Payer: PRIVATE HEALTH INSURANCE

## 2019-08-26 DIAGNOSIS — H35033 Hypertensive retinopathy, bilateral: Secondary | ICD-10-CM | POA: Diagnosis not present

## 2019-08-26 DIAGNOSIS — D3131 Benign neoplasm of right choroid: Secondary | ICD-10-CM | POA: Diagnosis not present

## 2019-08-26 DIAGNOSIS — H2513 Age-related nuclear cataract, bilateral: Secondary | ICD-10-CM | POA: Diagnosis not present

## 2019-08-26 DIAGNOSIS — H25013 Cortical age-related cataract, bilateral: Secondary | ICD-10-CM | POA: Diagnosis not present

## 2019-09-01 DIAGNOSIS — Z9884 Bariatric surgery status: Secondary | ICD-10-CM | POA: Diagnosis not present

## 2019-09-01 DIAGNOSIS — E785 Hyperlipidemia, unspecified: Secondary | ICD-10-CM | POA: Diagnosis not present

## 2019-09-01 DIAGNOSIS — Z85850 Personal history of malignant neoplasm of thyroid: Secondary | ICD-10-CM | POA: Diagnosis not present

## 2019-09-01 DIAGNOSIS — R7303 Prediabetes: Secondary | ICD-10-CM | POA: Diagnosis not present

## 2019-09-01 DIAGNOSIS — Z5181 Encounter for therapeutic drug level monitoring: Secondary | ICD-10-CM | POA: Diagnosis not present

## 2019-09-01 DIAGNOSIS — E890 Postprocedural hypothyroidism: Secondary | ICD-10-CM | POA: Diagnosis not present

## 2019-09-05 ENCOUNTER — Other Ambulatory Visit: Payer: Self-pay

## 2019-09-05 ENCOUNTER — Ambulatory Visit: Payer: PRIVATE HEALTH INSURANCE

## 2019-09-05 ENCOUNTER — Ambulatory Visit
Admission: RE | Admit: 2019-09-05 | Discharge: 2019-09-05 | Disposition: A | Discharge status: Home / Self Care | Payer: Medicare Other | Source: Ambulatory Visit | Attending: Registered Nurse | Admitting: Registered Nurse

## 2019-09-05 DIAGNOSIS — Z1231 Encounter for screening mammogram for malignant neoplasm of breast: Secondary | ICD-10-CM

## 2019-09-26 DIAGNOSIS — Z9884 Bariatric surgery status: Secondary | ICD-10-CM | POA: Diagnosis not present

## 2019-11-06 DIAGNOSIS — M25561 Pain in right knee: Secondary | ICD-10-CM | POA: Diagnosis not present

## 2019-11-06 DIAGNOSIS — M1711 Unilateral primary osteoarthritis, right knee: Secondary | ICD-10-CM | POA: Diagnosis not present

## 2019-11-28 DIAGNOSIS — M25561 Pain in right knee: Secondary | ICD-10-CM | POA: Diagnosis not present

## 2019-11-28 DIAGNOSIS — S83281A Other tear of lateral meniscus, current injury, right knee, initial encounter: Secondary | ICD-10-CM | POA: Diagnosis not present

## 2019-12-05 ENCOUNTER — Other Ambulatory Visit (HOSPITAL_COMMUNITY): Payer: Self-pay | Admitting: Surgery

## 2019-12-05 DIAGNOSIS — R109 Unspecified abdominal pain: Secondary | ICD-10-CM

## 2019-12-08 ENCOUNTER — Other Ambulatory Visit: Payer: Self-pay

## 2019-12-08 ENCOUNTER — Ambulatory Visit (HOSPITAL_COMMUNITY)
Admission: RE | Admit: 2019-12-08 | Discharge: 2019-12-08 | Disposition: A | Discharge status: Home / Self Care | Payer: Medicare Other | Source: Ambulatory Visit | Attending: Surgery | Admitting: Surgery

## 2019-12-08 DIAGNOSIS — R109 Unspecified abdominal pain: Secondary | ICD-10-CM | POA: Diagnosis not present

## 2019-12-08 DIAGNOSIS — K828 Other specified diseases of gallbladder: Secondary | ICD-10-CM | POA: Diagnosis not present

## 2019-12-10 DIAGNOSIS — K828 Other specified diseases of gallbladder: Secondary | ICD-10-CM | POA: Diagnosis not present

## 2019-12-13 DIAGNOSIS — M25561 Pain in right knee: Secondary | ICD-10-CM | POA: Diagnosis not present

## 2019-12-19 ENCOUNTER — Other Ambulatory Visit: Payer: Self-pay | Admitting: Surgery

## 2019-12-19 ENCOUNTER — Other Ambulatory Visit (HOSPITAL_COMMUNITY): Payer: Self-pay | Admitting: Surgery

## 2019-12-19 DIAGNOSIS — K828 Other specified diseases of gallbladder: Secondary | ICD-10-CM

## 2019-12-29 DIAGNOSIS — D509 Iron deficiency anemia, unspecified: Secondary | ICD-10-CM | POA: Diagnosis not present

## 2019-12-29 DIAGNOSIS — E7800 Pure hypercholesterolemia, unspecified: Secondary | ICD-10-CM | POA: Diagnosis not present

## 2019-12-29 DIAGNOSIS — E039 Hypothyroidism, unspecified: Secondary | ICD-10-CM | POA: Diagnosis not present

## 2019-12-29 DIAGNOSIS — I10 Essential (primary) hypertension: Secondary | ICD-10-CM | POA: Diagnosis not present

## 2019-12-29 DIAGNOSIS — E119 Type 2 diabetes mellitus without complications: Secondary | ICD-10-CM | POA: Diagnosis not present

## 2019-12-30 ENCOUNTER — Ambulatory Visit (HOSPITAL_COMMUNITY): Payer: Medicare Other

## 2019-12-31 DIAGNOSIS — M1711 Unilateral primary osteoarthritis, right knee: Secondary | ICD-10-CM | POA: Diagnosis not present

## 2019-12-31 DIAGNOSIS — S83281A Other tear of lateral meniscus, current injury, right knee, initial encounter: Secondary | ICD-10-CM | POA: Diagnosis not present

## 2020-01-12 ENCOUNTER — Encounter (HOSPITAL_COMMUNITY): Payer: Medicare Other

## 2020-01-19 ENCOUNTER — Ambulatory Visit (HOSPITAL_COMMUNITY)
Admission: RE | Admit: 2020-01-19 | Discharge: 2020-01-19 | Disposition: A | Discharge status: Home / Self Care | Payer: Medicare Other | Source: Ambulatory Visit | Attending: Surgery | Admitting: Surgery

## 2020-01-19 ENCOUNTER — Other Ambulatory Visit: Payer: Self-pay

## 2020-01-19 DIAGNOSIS — K828 Other specified diseases of gallbladder: Secondary | ICD-10-CM | POA: Insufficient documentation

## 2020-01-19 DIAGNOSIS — R1011 Right upper quadrant pain: Secondary | ICD-10-CM | POA: Diagnosis not present

## 2020-01-19 MED ORDER — TECHNETIUM TC 99M MEBROFENIN IV KIT
4.9000 | PACK | Freq: Once | INTRAVENOUS | Status: AC | PRN
  Administered 2020-01-19: 4.9 via INTRAVENOUS

## 2020-01-29 DIAGNOSIS — Z9089 Acquired absence of other organs: Secondary | ICD-10-CM | POA: Insufficient documentation

## 2020-01-29 DIAGNOSIS — N819 Female genital prolapse, unspecified: Secondary | ICD-10-CM | POA: Diagnosis not present

## 2020-01-29 DIAGNOSIS — Z23 Encounter for immunization: Secondary | ICD-10-CM | POA: Diagnosis not present

## 2020-02-03 DIAGNOSIS — X58XXXA Exposure to other specified factors, initial encounter: Secondary | ICD-10-CM | POA: Diagnosis not present

## 2020-02-03 DIAGNOSIS — S83271A Complex tear of lateral meniscus, current injury, right knee, initial encounter: Secondary | ICD-10-CM | POA: Diagnosis not present

## 2020-02-03 DIAGNOSIS — Y999 Unspecified external cause status: Secondary | ICD-10-CM | POA: Diagnosis not present

## 2020-02-03 DIAGNOSIS — S83231A Complex tear of medial meniscus, current injury, right knee, initial encounter: Secondary | ICD-10-CM | POA: Diagnosis not present

## 2020-02-03 DIAGNOSIS — M94261 Chondromalacia, right knee: Secondary | ICD-10-CM | POA: Diagnosis not present

## 2020-02-05 IMAGING — CR DG CHEST 2V
2 series · 2 of 2 positions shown · non-contrast
Comparison: 04/23/2017.

CLINICAL DATA: Preoperative bariatric surgery.

EXAM:
CHEST - 2 VIEW

[w chest pa]
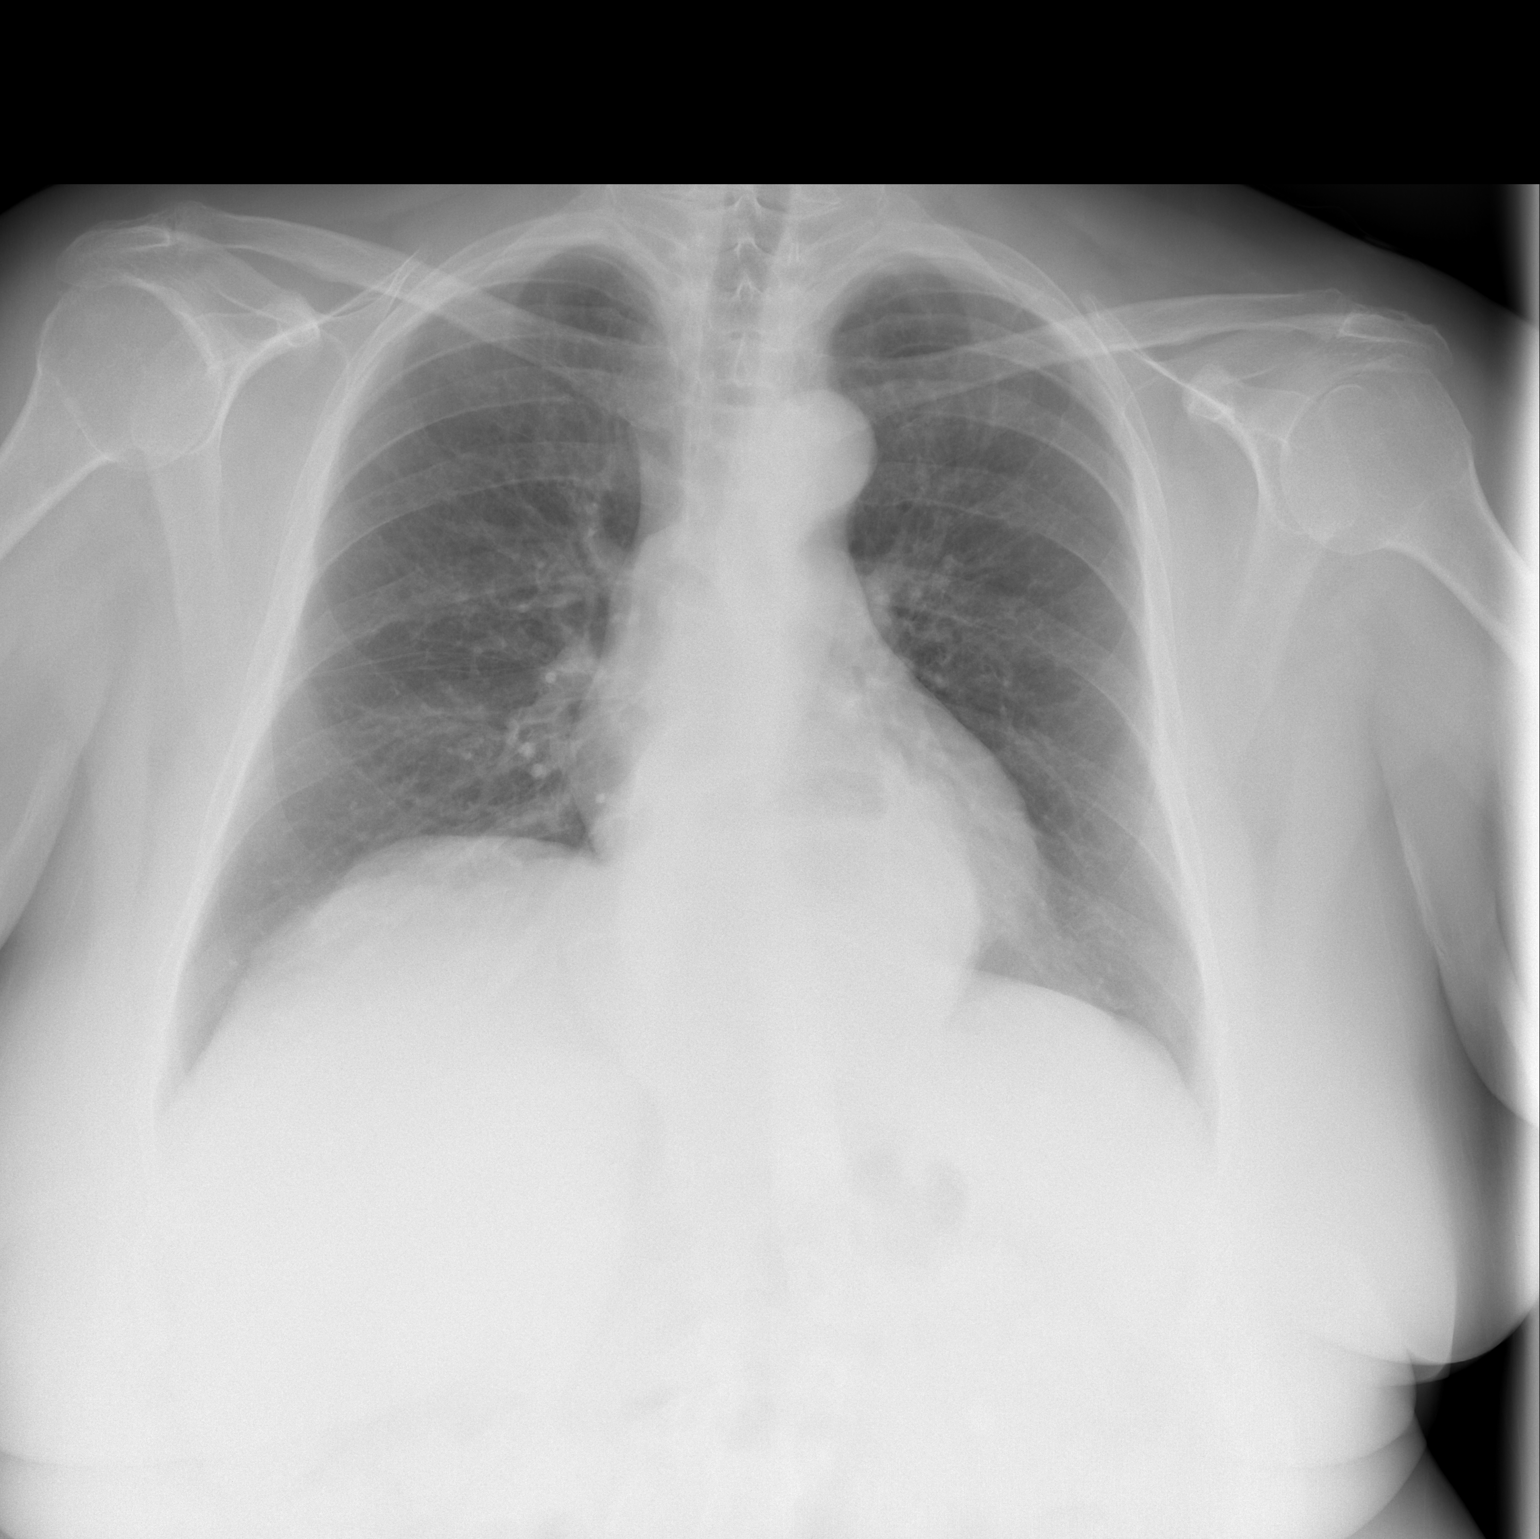

[w chest lat]
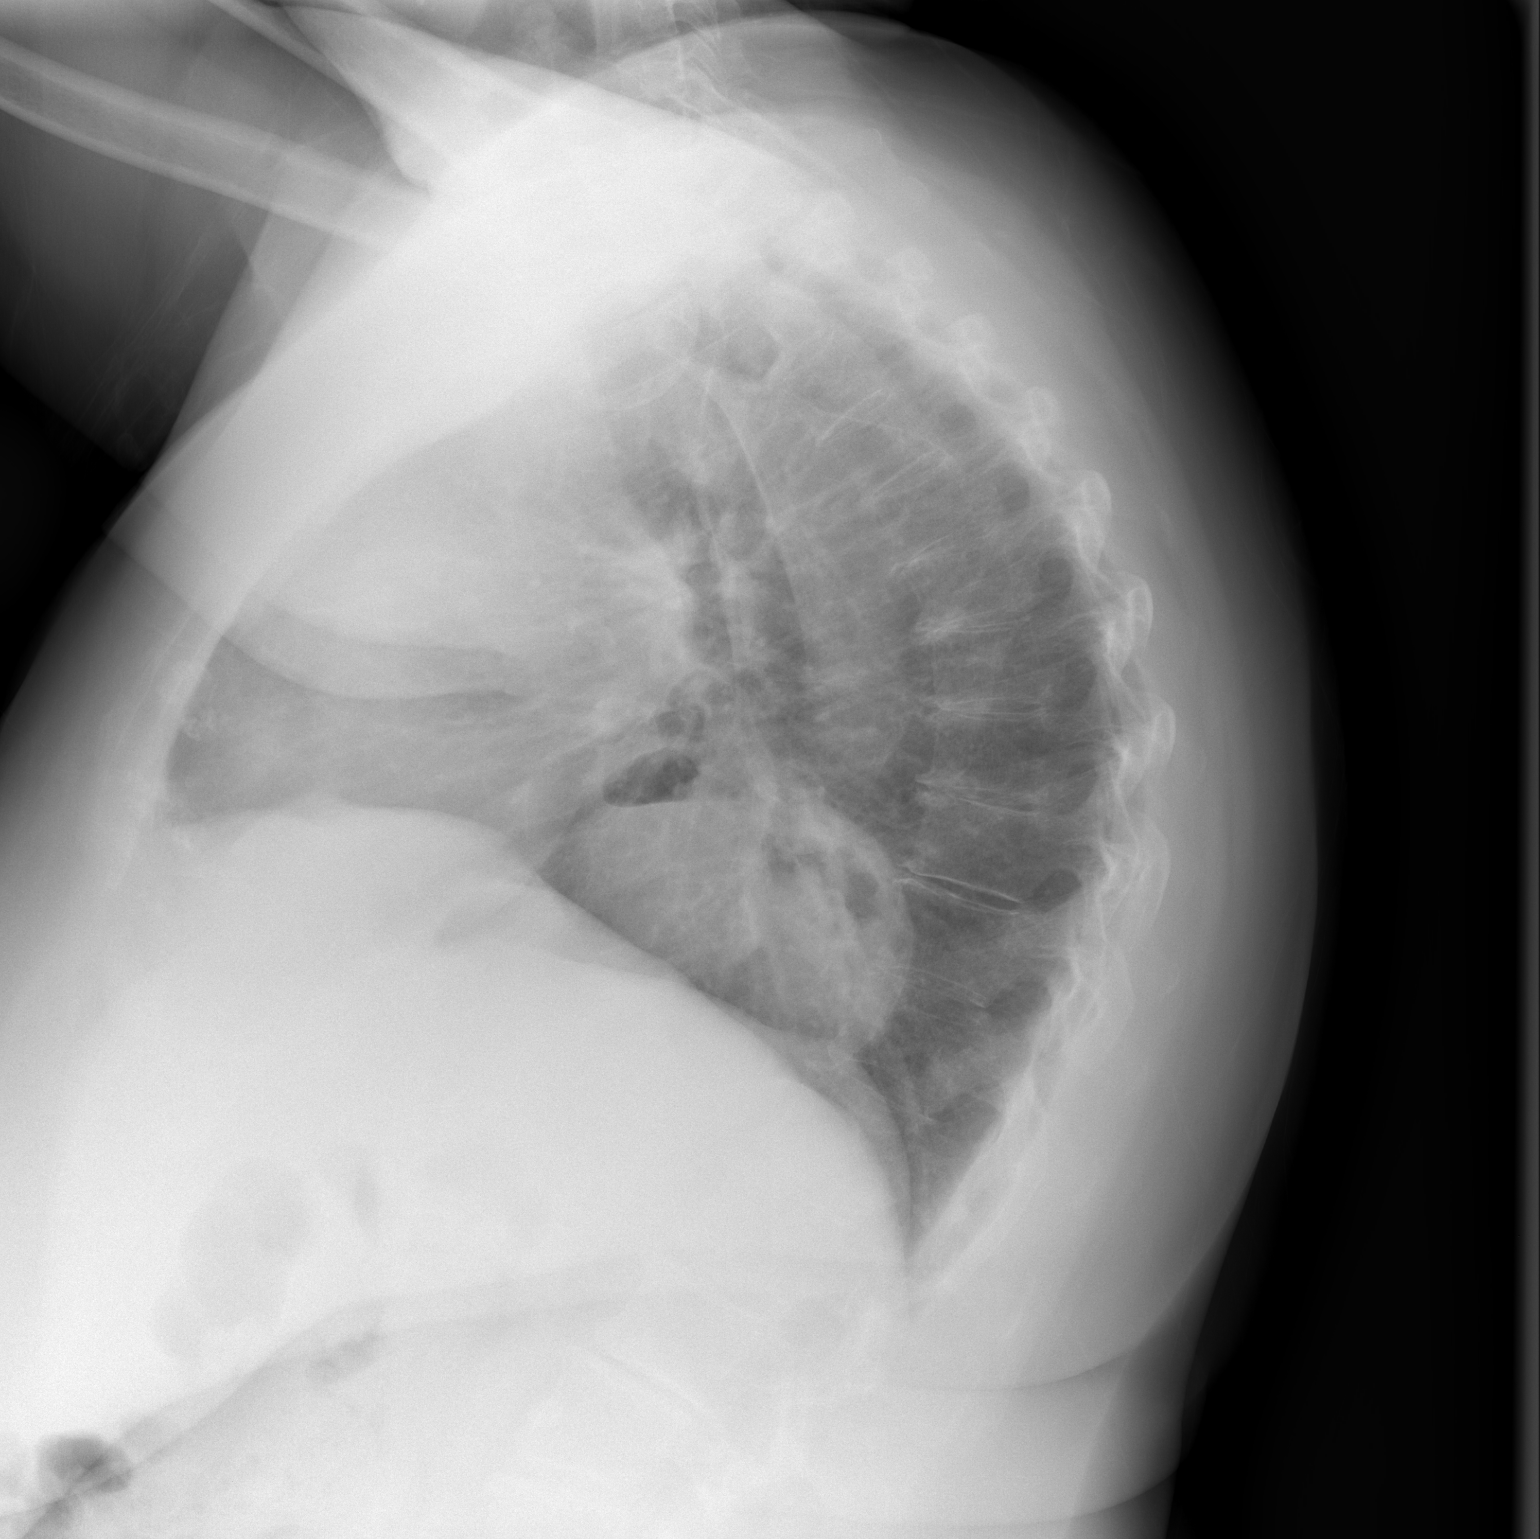

[2 of 2 positions shown; findings below may reference images not displayed]

FINDINGS: Mediastinum and hilar structures normal. Heart size normal. No focal
infiltrate. No pleural effusion or pneumothorax. Sliding hiatal
hernia.
IMPRESSION: 1.  Sliding hiatal hernia.

2.  No acute cardiopulmonary disease.  Exam stable from prior exam.

## 2020-02-10 DIAGNOSIS — M25561 Pain in right knee: Secondary | ICD-10-CM | POA: Diagnosis not present

## 2020-02-20 DIAGNOSIS — M25561 Pain in right knee: Secondary | ICD-10-CM | POA: Diagnosis not present

## 2020-02-23 ENCOUNTER — Other Ambulatory Visit: Payer: Self-pay | Admitting: Internal Medicine

## 2020-02-23 DIAGNOSIS — I10 Essential (primary) hypertension: Secondary | ICD-10-CM | POA: Diagnosis not present

## 2020-02-23 DIAGNOSIS — E8881 Metabolic syndrome: Secondary | ICD-10-CM | POA: Diagnosis not present

## 2020-02-23 DIAGNOSIS — Z23 Encounter for immunization: Secondary | ICD-10-CM | POA: Diagnosis not present

## 2020-02-23 DIAGNOSIS — R0989 Other specified symptoms and signs involving the circulatory and respiratory systems: Secondary | ICD-10-CM | POA: Diagnosis not present

## 2020-02-23 DIAGNOSIS — Z0000 Encounter for general adult medical examination without abnormal findings: Secondary | ICD-10-CM | POA: Diagnosis not present

## 2020-02-23 DIAGNOSIS — E039 Hypothyroidism, unspecified: Secondary | ICD-10-CM | POA: Diagnosis not present

## 2020-02-23 DIAGNOSIS — E785 Hyperlipidemia, unspecified: Secondary | ICD-10-CM | POA: Diagnosis not present

## 2020-02-24 DIAGNOSIS — M25561 Pain in right knee: Secondary | ICD-10-CM | POA: Diagnosis not present

## 2020-02-26 ENCOUNTER — Inpatient Hospital Stay: Admission: RE | Admit: 2020-02-26 | Payer: Medicare Other | Source: Ambulatory Visit

## 2020-02-26 ENCOUNTER — Ambulatory Visit
Admission: RE | Admit: 2020-02-26 | Discharge: 2020-02-26 | Disposition: A | Discharge status: Home / Self Care | Payer: Medicare Other | Source: Ambulatory Visit | Attending: Internal Medicine | Admitting: Internal Medicine

## 2020-02-26 DIAGNOSIS — E782 Mixed hyperlipidemia: Secondary | ICD-10-CM | POA: Diagnosis not present

## 2020-02-26 DIAGNOSIS — I10 Essential (primary) hypertension: Secondary | ICD-10-CM | POA: Diagnosis not present

## 2020-02-26 DIAGNOSIS — I7090 Unspecified atherosclerosis: Secondary | ICD-10-CM | POA: Diagnosis not present

## 2020-02-26 DIAGNOSIS — R0989 Other specified symptoms and signs involving the circulatory and respiratory systems: Secondary | ICD-10-CM

## 2020-02-26 DIAGNOSIS — I6522 Occlusion and stenosis of left carotid artery: Secondary | ICD-10-CM | POA: Diagnosis not present

## 2020-03-04 DIAGNOSIS — M25561 Pain in right knee: Secondary | ICD-10-CM | POA: Diagnosis not present

## 2020-03-09 DIAGNOSIS — M25561 Pain in right knee: Secondary | ICD-10-CM | POA: Diagnosis not present

## 2020-03-18 DIAGNOSIS — E890 Postprocedural hypothyroidism: Secondary | ICD-10-CM | POA: Diagnosis not present

## 2020-03-18 DIAGNOSIS — Z85850 Personal history of malignant neoplasm of thyroid: Secondary | ICD-10-CM | POA: Diagnosis not present

## 2020-03-18 DIAGNOSIS — Z9884 Bariatric surgery status: Secondary | ICD-10-CM | POA: Diagnosis not present

## 2020-03-18 DIAGNOSIS — E785 Hyperlipidemia, unspecified: Secondary | ICD-10-CM | POA: Diagnosis not present

## 2020-03-18 DIAGNOSIS — R7309 Other abnormal glucose: Secondary | ICD-10-CM | POA: Diagnosis not present

## 2020-03-23 DIAGNOSIS — M25561 Pain in right knee: Secondary | ICD-10-CM | POA: Diagnosis not present

## 2020-03-26 ENCOUNTER — Other Ambulatory Visit: Payer: Self-pay

## 2020-03-26 ENCOUNTER — Encounter: Payer: Self-pay | Admitting: Physical Therapy

## 2020-03-26 ENCOUNTER — Ambulatory Visit: Payer: Medicare Other | Attending: Obstetrics and Gynecology | Admitting: Physical Therapy

## 2020-03-26 DIAGNOSIS — R252 Cramp and spasm: Secondary | ICD-10-CM | POA: Diagnosis not present

## 2020-03-26 DIAGNOSIS — M6281 Muscle weakness (generalized): Secondary | ICD-10-CM | POA: Insufficient documentation

## 2020-03-26 DIAGNOSIS — R279 Unspecified lack of coordination: Secondary | ICD-10-CM | POA: Insufficient documentation

## 2020-03-26 NOTE — Patient Instructions (Signed)
Access Code: [email or web address] URL: [email or web address] Date: 03/26/2020 Prepared by: Jari Favre  Exercises Supine Diaphragmatic Breathing with Pelvic Floor Lengthening - 3 x daily - 7 x weekly - 1 sets - 10 reps Supine Bridge with Pelvic Floor Contraction - 3 x daily - 7 x weekly - 1 sets - 10 reps - 3 sec; rest 5 sec hold

## 2020-03-26 NOTE — Therapy (Addendum)
[facility] [address] [place], [place], [address] Phone: [phone]   Fax:  [phone]  Physical Therapy Evaluation  Patient Details  Name: BETHANIA SCHLOTZHAUER MRN: [id number] Date of Birth: [birth year] Referring Provider (PT): Dian Queen, MD   Encounter Date: 03/26/2020   PT End of Session - 03/26/20 0815    Visit Number 1    Date for PT Re-Evaluation 05/21/20    Authorization Type medicare/medicaid    PT Start Time 0804    PT Stop Time 0845    PT Time Calculation (min) 41 min    Activity Tolerance Patient tolerated treatment well    Behavior During Therapy [facility] for tasks assessed/performed           Past Medical History:  Diagnosis Date   Cancer ([place]) 2003   thyroid   Hypertension    Pre-diabetes    Metobolic Syndrome    Past Surgical History:  Procedure Laterality Date   BUNIONECTOMY     left foot   GASTRIC ROUX-EN-Y N/A 01/22/2018   Procedure: LAPAROSCOPIC ROUX-EN-Y GASTRIC BYPASS WITH UPPER ENDOSCOPY;  Surgeon: Johnathan Hausen, MD;  Location: [facility];  Service: General;  Laterality: N/A;   HIATAL HERNIA REPAIR N/A 01/22/2018   Procedure: LAPAROSCOPIC REPAIR OF HIATAL HERNIA;  Surgeon: Johnathan Hausen, MD;  Location: [facility];  Service: General;  Laterality: N/A;   LIPOMA EXCISION     THYROIDECTOMY  2003   TUBAL LIGATION      There were no vitals filed for this visit.    Subjective Assessment - 03/26/20 0808    Subjective I have to push things back up 2-3x/day.    Currently in Pain? Yes    Pain Score 3    6/10   Pain Location Groin    Pain Orientation Left    Pain Descriptors / Indicators Aching    Pain Type Chronic pain    Pain Onset More than a month ago    Pain Frequency Intermittent    Aggravating Factors  being up    Pain Relieving Factors pushing it back    Multiple Pain Sites No              [facility] PT Assessment - 03/26/20 0001      Assessment   Medical  Diagnosis N81.9 (ICD-10-CM) - Female genital prolapse, unspecified    Referring Provider (PT) Dian Queen, MD    Onset Date/Surgical Date --   July 2021   Prior Therapy No      Precautions   Precautions None      Restrictions   Weight Bearing Restrictions No      Balance Screen   Has the patient fallen in the past 6 months Yes   because of dogs; torn menisus/knee surgery   How many times? 2    Has the patient had a decrease in activity level because of a fear of falling?  No    Is the patient reluctant to leave their home because of a fear of falling?  No      Home Ec[place] residence    Living Arrangements Alone      Prior Function   Level of Independence Independent    Vocation Full time employment    Vocation Requirements mostly sitting; up and move hourly    Leisure pelaton, walking dog      Cognition   Overall Cognitive Status Within Functional Limits for tasks assessed  Posture/Postural Control   Posture/Postural Control Postural limitations    Postural Limitations Rounded Shoulders;Increased lumbar lordosis;Increased thoracic kyphosis;Weight shift left      ROM / Strength   AROM / PROM / Strength PROM;Strength      PROM   Overall PROM Comments Rt hip 25% limited      Strength   Overall Strength Comments core 4/5; Rt hip adduction 4/5 Lt hip adduction 4-/5      Flexibility   Soft Tissue Assessment /Muscle Length yes    Hamstrings 90%      Palpation   Palpation comment lumbar tight      Ambulation/Gait   Gait Pattern Within Functional Limits                      Objective measurements completed on examination: See above findings.     Pelvic Floor Special Questions - 03/26/20 0001    Prior Pelvic/Prostate Exam Yes    Are you Pregnant or attempting pregnancy? No    Prior Pregnancies Yes    Number of Pregnancies 3    Number of Vaginal Deliveries 3    Any difficulty with labor and deliveries No     Currently Sexually Active No    Urinary Leakage No    Urinary urgency No   sometimes emptying hard to do, can't stop stream   Urinary frequency nocturia every 2 hours    Fecal incontinence No    Fluid intake at least 64 oz    Falling out feeling (prolapse) Yes    Activities that cause feeling of prolapse all the time and up and moving    Skin Integrity Intact    Perineal Body/Introitus  Normal    Prolapse Anterior Wall;Uterine    Pelvic Floor Internal Exam pt identity confimred and informed consent given to perform internal Sof ttissue assessment    Exam Type Vaginal    Palpation high tone; weakness on Lt levator anterior attachment;     Strength weak squeeze, no lift    Strength # of reps 4   in 10 sec   Strength # of seconds 6    Tone high            [facility] Adult PT Treatment/Exercise - 03/26/20 0001      Self-Care   Self-Care Other Self-Care Comments    Other Self-Care Comments  intial HEP - educated and performed                  PT Education - 03/26/20 1058    Education Details Access Code: [email or web address]    Person(s) Educated Patient    Methods Explanation;Demonstration;Tactile cues;Verbal cues;Handout    Comprehension Verbalized understanding;Returned demonstration            PT Short Term Goals - 03/26/20 1033      PT SHORT TERM GOAL #1   Title ind with initial HEP    Time 2    Period Weeks    Status New    Target Date 04/09/20      PT SHORT TERM GOAL #2   Title Pt reports feeling at least 10% improvement of her symptoms    Time 4    Period Weeks    Status New    Target Date 04/23/20             PT Long Term Goals - 03/26/20 1034      PT LONG TERM GOAL #1   Title Pt reports  decreased  discomfort from NPRS of 6/10 at most down to 4/10 at most    Time 8    Period Weeks    Status New    Target Date 05/21/20      PT LONG TERM GOAL #2   Title Pt will be ind with advanced HEP for best outcomes if needing surgery    Time 8    Period Weeks     Status New    Target Date 05/21/20      PT LONG TERM GOAL #3   Title Pt will report only needing to reposition her bladder 1x/ day due to improved strength and endurance of the pelvic floor    Time 8    Period Weeks    Status New    Target Date 05/21/20      PT LONG TERM GOAL #4   Title Pt will be able to contract and hold pelvic floor contraction for 20 seconds in order to demonstrate improved endurance for normal daily activities    Baseline 6 sec    Time 8    Period Weeks    Status New    Target Date 05/21/20      PT LONG TERM GOAL #5   Title Pt will be able to do pelvic contraction of at least 20 mV or 3/5 MMT for greater lift and stability during functional movements    Time 8    Period Weeks    Status New    Target Date 05/21/20                  Plan - 03/26/20 1045    Clinical Impression Statement Pt presents to clinic today due to POP of uterus and bladder.  Pt has been manageing her symptoms by [facility] repositioning the bladder in the morning and several times througout the day.  Pt states that it took a long time to get the appointment and symptoms and discomfort have worsened since that time. Pt has posture abnormalities as mentioned above and core weakness.  Pt has weakness in adductors Lt > Rt.  Rt hip external rotation more tight than Lt hip.  Pt has pelvic floor that is slightly elevated in tone.  MMT of pelvic floor reveals weakness of 2/5 and can hold for 6 seconds. Pt can do 4 quick flicks in 10 seconds secondary to difficulty relaxing the muscles in between reps.  Pt will benefit from skilled PT to work on greater core strength and pelvic floor muscle control as well as addressing posture for maximum funcitonal outcomes.    Personal Factors and Comorbidities Age;Fitness;Comorbidity 2    Comorbidities 3 vaginal deliveries; time since onset and stage of prolapse    Examination-Activity Limitations Toileting;Stand;Sleep    Examination-Participation Restrictions  Community Activity    Stability/Clinical Decision Making Evolving/Moderate complexity    Clinical Decision Making Moderate    Rehab Potential Excellent    PT Frequency 1x / week    PT Duration 8 weeks    PT Treatment/Interventions ADLs/Self Care Home Management;Biofeedback;Electrical Stimulation;Cryotherapy;Iontophoresis 53m/ml Dexamethasone;Therapeutic exercise;Therapeutic activities;Neuromuscular re-education;Patient/family education;Manual techniques;Dry needling;Passive range of motion;Taping;Moist Heat    PT Next Visit Plan work on bulge and relax; intial core and pelvic floor strength, f/u on initial HEP    PT Home Exercise Plan Access Code: [email or web address]   Consulted and Agree with Plan of Care Patient           Patient will benefit from skilled therapeutic intervention in order to  improve the following deficits and impairments:  Pain, Postural dysfunction, Increased fascial restricitons, Decreased strength, Decreased coordination, Impaired tone, Decreased range of motion, Increased muscle spasms  Visit Diagnosis: Cramp and spasm  Unspecified lack of coordination  Muscle weakness (generalized)     Problem List Patient Active Problem List   Diagnosis Date Noted   Roux en Y gastric bypass with repair of large type III hiatal hernia 01/22/2018   Morbid obesity with body mass index (BMI) of 45.0 to 49.9 in adult ([place]) 01/16/2018   Hiatal hernia-large type III 01/16/2018    Camillo Flaming Delta Deshmukh, PT 03/26/2020, 12:27 PM  [facility] [address] [place], [place], [address] Phone: [phone]   Fax:  [phone]  Name: ZAYNA TOSTE MRN: [id number] Date of Birth: 1951/01/24  PHYSICAL THERAPY DISCHARGE SUMMARY  Visits from Start of Care: 1  Current functional level related to goals / functional outcomes: See above eval only   Remaining deficits: eval only   Education / Equipment: HEP  Plan: Patient  agrees to discharge.  Patient goals were not met. Patient is being discharged due to a change in medical status.  ?????    Pt is getting surgery  Gustavus Bryant, PT 04/26/20 7:53 AM

## 2020-03-30 ENCOUNTER — Ambulatory Visit: Payer: Medicare Other | Admitting: Physical Therapy

## 2020-04-01 DIAGNOSIS — N819 Female genital prolapse, unspecified: Secondary | ICD-10-CM | POA: Diagnosis not present

## 2020-04-08 ENCOUNTER — Encounter: Payer: Medicare Other | Admitting: Physical Therapy

## 2020-04-15 ENCOUNTER — Ambulatory Visit: Payer: Medicare Other | Admitting: Physical Therapy

## 2020-04-29 ENCOUNTER — Ambulatory Visit: Payer: Medicare Other | Admitting: Physical Therapy

## 2020-05-06 ENCOUNTER — Encounter: Payer: Medicare Other | Admitting: Physical Therapy

## 2020-05-07 DIAGNOSIS — E785 Hyperlipidemia, unspecified: Secondary | ICD-10-CM | POA: Diagnosis not present

## 2020-05-12 DIAGNOSIS — E890 Postprocedural hypothyroidism: Secondary | ICD-10-CM | POA: Diagnosis not present

## 2020-05-13 DIAGNOSIS — M1711 Unilateral primary osteoarthritis, right knee: Secondary | ICD-10-CM | POA: Diagnosis not present

## 2020-05-20 DIAGNOSIS — M1711 Unilateral primary osteoarthritis, right knee: Secondary | ICD-10-CM | POA: Diagnosis not present

## 2020-05-27 DIAGNOSIS — M1711 Unilateral primary osteoarthritis, right knee: Secondary | ICD-10-CM | POA: Diagnosis not present

## 2020-06-07 DIAGNOSIS — N8189 Other female genital prolapse: Secondary | ICD-10-CM | POA: Diagnosis not present

## 2020-06-07 DIAGNOSIS — N8110 Cystocele, unspecified: Secondary | ICD-10-CM | POA: Diagnosis not present

## 2020-06-07 DIAGNOSIS — N819 Female genital prolapse, unspecified: Secondary | ICD-10-CM | POA: Diagnosis not present

## 2020-06-07 DIAGNOSIS — D259 Leiomyoma of uterus, unspecified: Secondary | ICD-10-CM | POA: Diagnosis not present

## 2020-06-07 DIAGNOSIS — N816 Rectocele: Secondary | ICD-10-CM | POA: Diagnosis not present

## 2020-07-09 DIAGNOSIS — M1711 Unilateral primary osteoarthritis, right knee: Secondary | ICD-10-CM | POA: Diagnosis not present

## 2020-07-16 DIAGNOSIS — N760 Acute vaginitis: Secondary | ICD-10-CM | POA: Diagnosis not present

## 2020-07-16 DIAGNOSIS — B373 Candidiasis of vulva and vagina: Secondary | ICD-10-CM | POA: Diagnosis not present

## 2020-07-31 DIAGNOSIS — M25561 Pain in right knee: Secondary | ICD-10-CM | POA: Diagnosis not present

## 2020-08-24 DIAGNOSIS — E039 Hypothyroidism, unspecified: Secondary | ICD-10-CM | POA: Diagnosis not present

## 2020-08-24 DIAGNOSIS — E8881 Metabolic syndrome: Secondary | ICD-10-CM | POA: Diagnosis not present

## 2020-08-24 DIAGNOSIS — E785 Hyperlipidemia, unspecified: Secondary | ICD-10-CM | POA: Diagnosis not present

## 2020-09-19 DIAGNOSIS — Z23 Encounter for immunization: Secondary | ICD-10-CM | POA: Diagnosis not present

## 2020-10-15 DIAGNOSIS — M170 Bilateral primary osteoarthritis of knee: Secondary | ICD-10-CM | POA: Diagnosis not present

## 2020-10-18 DIAGNOSIS — N8189 Other female genital prolapse: Secondary | ICD-10-CM | POA: Diagnosis not present

## 2020-10-20 DIAGNOSIS — R7309 Other abnormal glucose: Secondary | ICD-10-CM | POA: Diagnosis not present

## 2020-10-20 DIAGNOSIS — E785 Hyperlipidemia, unspecified: Secondary | ICD-10-CM | POA: Diagnosis not present

## 2020-10-22 DIAGNOSIS — H35033 Hypertensive retinopathy, bilateral: Secondary | ICD-10-CM | POA: Diagnosis not present

## 2020-10-22 DIAGNOSIS — D3131 Benign neoplasm of right choroid: Secondary | ICD-10-CM | POA: Diagnosis not present

## 2020-10-22 DIAGNOSIS — H2513 Age-related nuclear cataract, bilateral: Secondary | ICD-10-CM | POA: Diagnosis not present

## 2020-10-22 DIAGNOSIS — H524 Presbyopia: Secondary | ICD-10-CM | POA: Diagnosis not present

## 2020-10-22 DIAGNOSIS — H25013 Cortical age-related cataract, bilateral: Secondary | ICD-10-CM | POA: Diagnosis not present

## 2020-10-26 DIAGNOSIS — E890 Postprocedural hypothyroidism: Secondary | ICD-10-CM | POA: Diagnosis not present

## 2020-10-26 DIAGNOSIS — E785 Hyperlipidemia, unspecified: Secondary | ICD-10-CM | POA: Diagnosis not present

## 2020-10-26 DIAGNOSIS — Z85850 Personal history of malignant neoplasm of thyroid: Secondary | ICD-10-CM | POA: Diagnosis not present

## 2020-10-26 DIAGNOSIS — Z9884 Bariatric surgery status: Secondary | ICD-10-CM | POA: Diagnosis not present

## 2020-10-26 DIAGNOSIS — R7303 Prediabetes: Secondary | ICD-10-CM | POA: Diagnosis not present

## 2020-11-15 ENCOUNTER — Other Ambulatory Visit: Payer: Self-pay | Admitting: Obstetrics and Gynecology

## 2020-11-15 DIAGNOSIS — Z1231 Encounter for screening mammogram for malignant neoplasm of breast: Secondary | ICD-10-CM

## 2020-11-17 ENCOUNTER — Other Ambulatory Visit: Payer: Self-pay

## 2020-11-17 ENCOUNTER — Ambulatory Visit
Admission: RE | Admit: 2020-11-17 | Discharge: 2020-11-17 | Disposition: A | Discharge status: Home / Self Care | Payer: Medicare Other | Source: Ambulatory Visit | Attending: Obstetrics and Gynecology | Admitting: Obstetrics and Gynecology

## 2020-11-17 DIAGNOSIS — Z1231 Encounter for screening mammogram for malignant neoplasm of breast: Secondary | ICD-10-CM

## 2020-11-24 ENCOUNTER — Ambulatory Visit (INDEPENDENT_AMBULATORY_CARE_PROVIDER_SITE_OTHER): Payer: Medicare Other | Admitting: Family Medicine

## 2020-11-24 ENCOUNTER — Encounter: Payer: Self-pay | Admitting: Family Medicine

## 2020-11-24 ENCOUNTER — Other Ambulatory Visit: Payer: Self-pay

## 2020-11-24 VITALS — Ht 60.0 in | Wt 203.0 lb

## 2020-11-24 DIAGNOSIS — G5602 Carpal tunnel syndrome, left upper limb: Secondary | ICD-10-CM

## 2020-11-24 NOTE — Progress Notes (Signed)
PCP: Deland Pretty, MD  Subjective:   HPI: Patient is a 70 y.o. female here for left hand numbness.  Patient reports over the past 2 weeks she's developed numbness throughout left hand. Worse in morning, when holding cell phone. Sometimes wakes up with this. She is doing pilates and had been playing pickle ball until this started. She is ambidextrous. Occasional shoulder numbness but no neck, shoulder, elbow pain.  Past Medical History:  Diagnosis Date   Cancer ([place]) 2003   thyroid   Hypertension    Pre-diabetes    Metobolic Syndrome    Current Outpatient Medications on File Prior to Visit  Medication Sig Dispense Refill   acetaminophen (TYLENOL) 500 MG tablet Take 1,000 mg by mouth 2 (two) times daily as needed for headache.     Calcium Carbonate-Vitamin D (CALCIUM-D PO) Take 1 tablet by mouth 2 (two) times daily.     diclofenac sodium (VOLTAREN) 1 % GEL Apply 1 application topically daily as needed (pain).     EPINEPHrine (EPIPEN JR) 0.15 MG/0.3ML injection Inject 0.15 mg into the muscle as needed for anaphylaxis.   1   hydrochlorothiazide (HYDRODIURIL) 25 MG tablet Take 25 mg by mouth daily.     Multiple Vitamin (MULTIVITAMIN WITH MINERALS) TABS tablet Take 1 tablet by mouth daily.     SYNTHROID 112 MCG tablet Take 112 mcg by mouth every morning.     zolpidem (AMBIEN) 10 MG tablet Take 2.5 mg by mouth at bedtime as needed for sleep.  1   No current facility-administered medications on file prior to visit.    Past Surgical History:  Procedure Laterality Date   BUNIONECTOMY     left foot   GASTRIC ROUX-EN-Y N/A 01/22/2018   Procedure: LAPAROSCOPIC ROUX-EN-Y GASTRIC BYPASS WITH UPPER ENDOSCOPY;  Surgeon: Johnathan Hausen, MD;  Location: [facility];  Service: General;  Laterality: N/A;   HIATAL HERNIA REPAIR N/A 01/22/2018   Procedure: LAPAROSCOPIC REPAIR OF HIATAL HERNIA;  Surgeon: Johnathan Hausen, MD;  Location: [facility];  Service: General;  Laterality: N/A;   LIPOMA EXCISION      THYROIDECTOMY  2003   TUBAL LIGATION      Allergies  Allergen Reactions   Shellfish Allergy Hives   Cephalexin Itching    Other reaction(s): Flushing   Penicillins Rash    Has patient had a PCN reaction causing immediate rash, facial/tongue/throat swelling, SOB or lightheadedness with hypotension: Yes Has patient had a PCN reaction causing severe rash involving mucus membranes or skin necrosis: No Has patient had a PCN reaction that required hospitalization: No Has patient had a PCN reaction occurring within the last 10 years: No If all of the above answers are "NO", then may proceed with Cephalosporin use.    Sulfa Antibiotics Rash   Atorvastatin     Other reaction(s): muscle aches   Statins     Other reaction(s): muscle aches    Social History   Socioeconomic History   Marital status: Divorced    Spouse name: Not on file   Number of children: Not on file   Years of education: Not on file   Highest education level: Not on file  Occupational History   Not on file  Tobacco Use   Smoking status: Former    Pack years: 0.00    Types: Cigarettes    Quit date: 06/23/1975    Years since quitting: 45.4   Smokeless tobacco: Never  Vaping Use   Vaping Use: Never used  Substance and Sexual  Activity   Alcohol use: Yes    Comment: socially   Drug use: Never   Sexual activity: Not on file  Other Topics Concern   Not on file  Social History Narrative   Not on file   Social Determinants of Health   Financial Resource Strain: Not on file  Food Insecurity: Not on file  Transportation Needs: Not on file  Physical Activity: Not on file  Stress: Not on file  Social Connections: Not on file  Intimate Partner Violence: Not on file    Family History  Problem Relation Age of Onset   Hypertension Other    Heart disease Other     Ht 5' (1.524 m)   Wt 203 lb (92.1 kg)   BMI 39.65 kg/m   No flowsheet data found.  No flowsheet data found.  Review of Systems: See HPI  above.     Objective:  Physical Exam:  Gen: NAD, comfortable in exam room  Left hand/wrist: No deformity. FROM with 5/5 strength. No tenderness to palpation. NVI distally. Sensation intact to light touch. Negative tinels.  Positive phalens.   Assessment & Plan:  1. Left carpal tunnel syndrome - start with wrist brace at bedtime, voltaren gel.  F/u in 1 month.

## 2020-11-24 NOTE — Patient Instructions (Signed)
You have carpal tunnel syndrome. Wear the wrist brace at nighttime and as often as possible during the day Voltaren gel topically up to 4 times a day to your wrist where the nerve is being pinched. A prednisone dose pack is a consideration. Corticosteroid injection is a consideration to help with pain and inflammation. If not improving, will consider nerve conduction studies to assess severity. Follow up with me in 1 month. Continue with pilates - I'd wait a couple weeks before returning to pickle ball out of the brace but continue wearing it at nighttime.

## 2020-12-22 ENCOUNTER — Other Ambulatory Visit: Payer: Self-pay

## 2020-12-22 ENCOUNTER — Ambulatory Visit (INDEPENDENT_AMBULATORY_CARE_PROVIDER_SITE_OTHER): Payer: Medicare Other | Admitting: Family Medicine

## 2020-12-22 VITALS — BP 122/84 | Ht 60.0 in | Wt 208.0 lb

## 2020-12-22 DIAGNOSIS — G5602 Carpal tunnel syndrome, left upper limb: Secondary | ICD-10-CM | POA: Diagnosis not present

## 2020-12-22 DIAGNOSIS — M7712 Lateral epicondylitis, left elbow: Secondary | ICD-10-CM

## 2020-12-22 DIAGNOSIS — M25522 Pain in left elbow: Secondary | ICD-10-CM

## 2020-12-22 MED ORDER — METHYLPREDNISOLONE ACETATE 40 MG/ML IJ SUSP
40.0000 mg | Freq: Once | INTRAMUSCULAR | Status: AC
  Administered 2020-12-22: 40 mg via INTRA_ARTICULAR

## 2020-12-22 NOTE — Progress Notes (Signed)
PCP: Deland Pretty, MD  Subjective:   HPI: Patient is a pleasant 70 y.o. female here for follow-up of carpal tunnel and new left elbow pain.  Patient was seen about 1 month ago and diagnosed with carpal tunnel of the left wrist, she has been using a nighttime splint as well as using topical Voltaren gel for the pain, however over the last few weeks her pain has not improved and she is getting worsening numbness and tingling in her thumb and index finger specifically.  She notes the pain is worse when she is typing and using her phone, she holds this with her left hand.  She denies any loss of grip strength or muscle weakness.   In addition to the wrist, she feels like she has been experiencing left lateral elbow pain as well.  She first noticed this after wearing the wrist brace but feels it may have bothered her in the past as well.  Pain is located over the lateral elbow, she points to her lateral epicondyle.  Pain will be worse with movement of the arm as well as flexing/extending the wrist.  Not taking anything for the pain.  She denies any numbness or tingling or radiation from the elbow, pain only.  The patient is an avid Doctor, general practice, although she has been doing this last secondary to her carpal tunnel pain.  Past Medical History:  Diagnosis Date   Cancer ([place]) 2003   thyroid   Hypertension    Pre-diabetes    Metobolic Syndrome    Current Outpatient Medications on File Prior to Visit  Medication Sig Dispense Refill   acetaminophen (TYLENOL) 500 MG tablet Take 1,000 mg by mouth 2 (two) times daily as needed for headache.     Calcium Carbonate-Vitamin D (CALCIUM-D PO) Take 1 tablet by mouth 2 (two) times daily.     diclofenac sodium (VOLTAREN) 1 % GEL Apply 1 application topically daily as needed (pain).     EPINEPHrine (EPIPEN JR) 0.15 MG/0.3ML injection Inject 0.15 mg into the muscle as needed for anaphylaxis.   1   hydrochlorothiazide (HYDRODIURIL) 25 MG tablet Take 25 mg by  mouth daily.     Multiple Vitamin (MULTIVITAMIN WITH MINERALS) TABS tablet Take 1 tablet by mouth daily.     SYNTHROID 112 MCG tablet Take 112 mcg by mouth every morning.     zolpidem (AMBIEN) 10 MG tablet Take 2.5 mg by mouth at bedtime as needed for sleep.  1   No current facility-administered medications on file prior to visit.    Past Surgical History:  Procedure Laterality Date   BUNIONECTOMY     left foot   GASTRIC ROUX-EN-Y N/A 01/22/2018   Procedure: LAPAROSCOPIC ROUX-EN-Y GASTRIC BYPASS WITH UPPER ENDOSCOPY;  Surgeon: Johnathan Hausen, MD;  Location: [facility];  Service: General;  Laterality: N/A;   HIATAL HERNIA REPAIR N/A 01/22/2018   Procedure: LAPAROSCOPIC REPAIR OF HIATAL HERNIA;  Surgeon: Johnathan Hausen, MD;  Location: [facility];  Service: General;  Laterality: N/A;   LIPOMA EXCISION     THYROIDECTOMY  2003   TUBAL LIGATION      Allergies  Allergen Reactions   Shellfish Allergy Hives   Cephalexin Itching    Other reaction(s): Flushing   Penicillins Rash    Has patient had a PCN reaction causing immediate rash, facial/tongue/throat swelling, SOB or lightheadedness with hypotension: Yes Has patient had a PCN reaction causing severe rash involving mucus membranes or skin necrosis: No Has patient had a PCN reaction  that required hospitalization: No Has patient had a PCN reaction occurring within the last 10 years: No If all of the above answers are "NO", then may proceed with Cephalosporin use.    Sulfa Antibiotics Rash   Atorvastatin     Other reaction(s): muscle aches   Statins     Other reaction(s): muscle aches    Social History   Socioeconomic History   Marital status: Divorced    Spouse name: Not on file   Number of children: Not on file   Years of education: Not on file   Highest education level: Not on file  Occupational History   Not on file  Tobacco Use   Smoking status: Former    Types: Cigarettes    Quit date: 06/23/1975    Years since quitting:  45.5   Smokeless tobacco: Never  Vaping Use   Vaping Use: Never used  Substance and Sexual Activity   Alcohol use: Yes    Comment: socially   Drug use: Never   Sexual activity: Not on file  Other Topics Concern   Not on file  Social History Narrative   Not on file   Social Determinants of Health   Financial Resource Strain: Not on file  Food Insecurity: Not on file  Transportation Needs: Not on file  Physical Activity: Not on file  Stress: Not on file  Social Connections: Not on file  Intimate Partner Violence: Not on file    Family History  Problem Relation Age of Onset   Hypertension Other    Heart disease Other     BP 122/84   Ht 5' (1.524 m)   Wt 208 lb (94.3 kg)   BMI 40.62 kg/m   No flowsheet data found.  No flowsheet data found.  Review of Systems: See HPI above.     Objective:  Physical Exam:  Gen: Well-appearing, in no acute distress; non-toxic CV: Regular Rate. Well-perfused. Warm.  Resp: Breathing unlabored on room air; no wheezing. Psych: Fluid speech in conversation; appropriate affect; normal thought process Neuro: Sensation intact throughout. No gross coordination deficits.  MSK:   Left Elbow: There is some tenderness to palpation over the lateral epicondyle directly over the bone as well as about 1 cm distal.  Inspection yielded no erythema, ecchymosis or bony deformity.  There is full range of motion in all directions.  No tenderness to palpation over the radial head or medial epicondyle.  There is some tightness experienced when palpating up the radial extensor muscles.  No effusion. No varus or valgus instability. Negative milking maneuver and moving valgus stress test. Negative Tinel's test at the cubital tunnel.   Left Wrist: There is no significant tenderness to palpation on the radial dorsal aspect of the wrist.  Inspection yielded no erythema, ecchymosis or bony deformity.  Range of motion is full with good flexion and extension and  ulnar/radial deviation.  There is some pain with end range flexion and extension of the wrist; pain with resisted wrist extension.  Palpation is normal over the metacarpals, scaphoid, lunate. + Phalen's and reverse Phalen's test with N/T in first 3 digits.   Assessment & Plan:  1. Carpal tunnel syndrome, left wrist - worsening. 2. Left elbow pain - likely 2/2 to lateral epicondylitis. There may be some biomechanical compensation from the carpal tunnel and wrist brace.   Procedure: After informed written consent timeout was performed, patient was seated on exam table. Area overlying the patient's Left carpal tunnel prepped with alcohol  swab then utilizing ultrasound guidance, patient's left carpal tunnel injected with 2:1 lidocaine: depomedrol with hydrodissection of median nerve from the flexor retinaculum on the superior and inferior aspect of the nerve. Patient tolerated procedure well without immediate complications.  Plan:  - U/S guided cortisone injection today - may continue ice, voltaren gel as needed for pain - continue to wear the wrist splint at bedtime in comfortable position - discussed proper ergonomics and avoiding holding phone and fine motor movements of the wrist   - patient was fitted for a counter-force brace for her forearm today - home exercises were demonstrated today for her lateral epicondylitis - ice/tylenol or voltaren gel as needed for the elbow - f/u in 1 month if pain not improved  Elba Barman, DO PGY-4, Sports Medicine Fellow [place]

## 2020-12-22 NOTE — Patient Instructions (Signed)
It was great to see you today, thank you for letting me participate in your care!  Today, we discussed: your carpal tunnel and elbow pain--likely lateral epicondylitis.  - we gave you a cortisone shot for the carpal tunnel, this should improve over the next few days - continue to wear your wrist splint at bedtime - ice for pain control  For your elbow: - we gave you a counter-force brace to wear when awaken and using the wrist/arm - perform the home exercises that Jinny Blossom will show you - may apply ice or voltaren gel to the bony part of the elbow that is painful for you as well  You will follow-up in 1 month if not improved.  If you have any further questions, please give the clinic a call [phone].  [place],  Elba Barman, DO PGY-4, Sports Medicine Fellow [place]

## 2020-12-29 DIAGNOSIS — M1711 Unilateral primary osteoarthritis, right knee: Secondary | ICD-10-CM | POA: Diagnosis not present

## 2021-01-05 DIAGNOSIS — M1711 Unilateral primary osteoarthritis, right knee: Secondary | ICD-10-CM | POA: Diagnosis not present

## 2021-01-10 DIAGNOSIS — Z20822 Contact with and (suspected) exposure to covid-19: Secondary | ICD-10-CM | POA: Diagnosis not present

## 2021-01-12 ENCOUNTER — Encounter: Payer: Self-pay | Admitting: Physical Therapy

## 2021-01-12 ENCOUNTER — Other Ambulatory Visit: Payer: Self-pay

## 2021-01-12 ENCOUNTER — Ambulatory Visit: Payer: Medicare Other | Attending: Obstetrics and Gynecology | Admitting: Physical Therapy

## 2021-01-12 DIAGNOSIS — M6281 Muscle weakness (generalized): Secondary | ICD-10-CM

## 2021-01-12 DIAGNOSIS — R252 Cramp and spasm: Secondary | ICD-10-CM | POA: Insufficient documentation

## 2021-01-12 DIAGNOSIS — R279 Unspecified lack of coordination: Secondary | ICD-10-CM | POA: Diagnosis not present

## 2021-01-12 DIAGNOSIS — M1711 Unilateral primary osteoarthritis, right knee: Secondary | ICD-10-CM | POA: Diagnosis not present

## 2021-01-12 NOTE — Therapy (Signed)
[facility] [address], [place], [place], [address] Phone: [phone]   Fax:  [phone]  Physical Therapy Evaluation  Patient Details  Name: Carol Sanchez MRN: [id number] Date of Birth: 10/16/1950 Referring Provider (PT): Dr. Dian Queen   Encounter Date: 01/12/2021   PT End of Session - 01/12/21 1221     Visit Number 1    Date for PT Re-Evaluation 04/06/21    Authorization Type medicare/[employer]    Authorization - Visit Number 1    Authorization - Number of Visits 10    PT Start Time [id number]    PT Stop Time 1230    PT Time Calculation (min) 45 min    Activity Tolerance Patient tolerated treatment well    Behavior During Therapy [facility] for tasks assessed/performed             Past Medical History:  Diagnosis Date   Cancer ([place]) 2003   thyroid   Hypertension    Pre-diabetes    Metobolic Syndrome    Past Surgical History:  Procedure Laterality Date   BUNIONECTOMY     left foot   GASTRIC ROUX-EN-Y N/A 01/22/2018   Procedure: LAPAROSCOPIC ROUX-EN-Y GASTRIC BYPASS WITH UPPER ENDOSCOPY;  Surgeon: Johnathan Hausen, MD;  Location: [facility];  Service: General;  Laterality: N/A;   HIATAL HERNIA REPAIR N/A 01/22/2018   Procedure: LAPAROSCOPIC REPAIR OF HIATAL HERNIA;  Surgeon: Johnathan Hausen, MD;  Location: [facility];  Service: General;  Laterality: N/A;   LIPOMA EXCISION     THYROIDECTOMY  2003   TUBAL LIGATION      There were no vitals filed for this visit.    Subjective Assessment - 01/12/21 1151     Subjective Patient is unable to hold her urine and stop the urine. She feels her bladder when puts her finger in the vaginal canal she can feel the bladder. Patient will urinate then has to stand then sit down to fully empty the bladder. Urinates 2-5. No pads. No straining with bowel movements. No urgency. last month the pelvic pain is better. It is not constant.    Patient Stated Goals reduce to 2 times with  night time urinating; strengthen the pelvic floor    Currently in Pain? Yes    Pain Score 5     Pain Location Pelvis    Pain Orientation Left    Pain Descriptors / Indicators Sharp    Pain Type Acute pain    Pain Onset More than a month ago    Pain Frequency Intermittent    Aggravating Factors  pilates, when sitting on edge of bed and move left leg to place on bed.    Pain Relieving Factors not moving the left leg or not doing pilates    Multiple Pain Sites No                [facility] PT Assessment - 01/12/21 0001       Assessment   Medical Diagnosis N81.89 Other female genital prolapse    Referring Provider (PT) Dr. Dian Queen    Onset Date/Surgical Date --   06/07/2020   Prior Therapy none      Precautions   Precautions Other (comment)    Precaution Comments Thyroid cancer      Restrictions   Weight Bearing Restrictions No      Balance Screen   Has the patient fallen in the past 6 months No    Has the patient had  a decrease in activity level because of a fear of falling?  No    Is the patient reluctant to leave their home because of a fear of falling?  No      Home Ecologist residence      Prior Function   Level of Independence Independent    Vocation Retired    Leisure pilates 2x; walk daily      Cognition   Overall Cognitive Status Within Functional Limits for tasks assessed      Posture/Postural Control   Posture/Postural Control Postural limitations    Postural Limitations Increased thoracic kyphosis      ROM / Strength   AROM / PROM / Strength AROM;PROM;Strength      PROM   Right Hip External Rotation  65    Left Hip External Rotation  40      Strength   Right Hip Extension 4/5    Right Hip ABduction 3-/5    Right Hip ADduction 4/5    Left Hip Extension 4-/5    Left Hip ABduction 3+/5    Left Hip ADduction 4/5      Palpation   SI assessment  left ilium posteriorly rotated    Palpation comment left psoas is  tender, difficulty with opening her lower rib cage      Special Tests    Special Tests Hip Special Tests      Trendelenburg Test   Findings Positive    Side Right    Comments left hip drops                 No emotional/communication barriers or cognitive limitation. Patient is motivated to learn. Patient understands and agrees with treatment goals and plan. PT explains patient will be examined in standing, sitting, and lying down to see how their muscles and joints work. When they are ready, they will be asked to remove their underwear so PT can examine their perineum. The patient is also given the option of providing their own chaperone as one is not provided in our facility. The patient also has the right and is explained the right to defer or refuse any part of the evaluation or treatment including the internal exam. With the patient's consent, PT will use one gloved finger to gently assess the muscles of the pelvic floor, seeing how well it contracts and relaxes and if there is muscle symmetry. After, the patient will get dressed and PT and patient will discuss exam findings and plan of care. PT and patient discuss plan of care, schedule, attendance policy and HEP activities.        Objective measurements completed on examination: See above findings.     Pelvic Floor Special Questions - 01/12/21 0001     Prior Pregnancies Yes    Number of Pregnancies 3    Number of Vaginal Deliveries 3    Any difficulty with labor and deliveries --   small amount tear   Currently Sexually Active No    Urinary Leakage No    Urinary urgency No    Fecal incontinence No    Fluid intake alot of water    Falling out feeling (prolapse) Yes   grade 1 cystocele   Skin Integrity Intact    Prolapse Anterior Wall   very small   Pelvic Floor Internal Exam Patient confirms identification and approves PT to assess pelvic floor and treatment    Exam Type Vaginal    Palpation tightness  on the sides  of the introitus, right side of the bladder; tenderness located in the left upper quadrant of the pelvic floor    Strength --   2/5 on the sides of the introitus and 3/5 ant. and post.             [facility] Adult PT Treatment/Exercise - 01/12/21 0001       Self-Care   Self-Care Other Self-Care Comments    Other Self-Care Comments  eudcation on letting her Pilates instructor know about working on her Gluteus Medius strength      Manual Therapy   Manual Therapy Soft tissue mobilization    Soft tissue mobilization to the psoas while perfroming manual work internally to the left upper quadrant of the pelvis                    PT Education - 01/12/21 1420     Education Details talked to pateint to let her Pilates instructor know about strengthening the gluteus medius    Person(s) Educated Patient    Methods Explanation    Comprehension Verbalized understanding              PT Short Term Goals - 01/12/21 1433       PT SHORT TERM GOAL #1   Title ind with initial HEP    Time 4    Period Weeks    Status New    Target Date 02/09/21      PT SHORT TERM GOAL #2   Title understand ways to manage prolapse with laying down in middle of day, keeping bowels regular, lifting with pressure management    Time 4    Period Weeks    Status New    Target Date 02/09/21               PT Long Term Goals - 01/12/21 1434       PT LONG TERM GOAL #1   Title independent with advanced HEP for pelvic floor and understand ways for pressure management with her pilates exericse    Time 12    Period Weeks    Status New    Target Date 04/06/21      PT LONG TERM GOAL #2   Title left lower quadrant pain with pilates decreased </= 1/10 due to pelvis in alignement    Time 12    Period Weeks    Status New    Target Date 04/06/21      PT LONG TERM GOAL #3   Title pain with lifting the left leg in a sitting position into her bed decrease </= 1/10.    Time 12    Period Weeks     Status New    Target Date 04/06/21      PT LONG TERM GOAL #4   Title pelvic floor strength is >/= 3/5 with a circular contraction with the lateral introitus contracting the same with anterior and posterior.    Baseline --    Time 12    Period Weeks      PT LONG TERM GOAL #5   Title --------                    Plan - 01/12/21 1423     Clinical Impression Statement Patient is a 70 year old female with cystocele grade 1 and left lower quadrant pain. Patient reports intermitent  pain in the left lower quadrant at level 5/10 with pilates  and when she brings her left leg onto her bed from a sitting position. Left ilium is rotated posteriorly. Tendernes located on the left Psoas. Bilateral hip abduction, adduction and left hip extension between 3-/5-4/5. Pelvic floor strength anterior and posterior 3/5 and on the lateral sides is 2/5. Patient has tightness in the sides of the introitus and right side of the bladder. When patient bears down the therapist can see the anterior wall descend slighly. Patient is not able to stop her stream in the middle of urination. Patient will benefit from skilled therapy to improve tissue mobility for a better pelvic floor contraction, reduction of left lower quadrant pain, and prolapse management.    Personal Factors and Comorbidities Comorbidity 3+;Fitness    Comorbidities Hysteretomy wiht cystocele repair 04/07/2021; thyroid cancer; Hiatal hernia repair 01/22/2018    Examination-Activity Limitations Toileting;Continence;Transfers    Stability/Clinical Decision Making Stable/Uncomplicated    Clinical Decision Making Low    Rehab Potential Excellent    PT Frequency 1x / week    PT Duration 12 weeks    PT Treatment/Interventions ADLs/Self Care Home Management;Biofeedback;Therapeutic activities;Therapeutic exercise;Neuromuscular re-education;Patient/family education;Manual techniques    PT Next Visit Plan work on the sides of the introitus to work on a  circular contraction; mobilization of the left psoas, check pelvic alignment, gluteus medius strength with pushing into a wall    Consulted and Agree with Plan of Care Patient             Patient will benefit from skilled therapeutic intervention in order to improve the following deficits and impairments:  Decreased coordination, Increased fascial restricitons, Decreased endurance, Decreased activity tolerance, Pain, Decreased strength, Decreased mobility  Visit Diagnosis: Muscle weakness (generalized) - Plan: PT plan of care cert/re-cert  Unspecified lack of coordination - Plan: PT plan of care cert/re-cert     Problem List Patient Active Problem List   Diagnosis Date Noted   Roux en Y gastric bypass with repair of large type III hiatal hernia 01/22/2018   Morbid obesity with body mass index (BMI) of 45.0 to 49.9 in adult ([place]) 01/16/2018   Hiatal hernia-large type III 01/16/2018    Earlie Counts, PT 01/12/21 2:42 PM  [facility] [address], [place], [place], [address] Phone: [phone]   Fax:  [phone]  Name: Carol Sanchez MRN: [id number] Date of Birth: June 28, 1950

## 2021-01-18 DIAGNOSIS — Z7184 Encounter for health counseling related to travel: Secondary | ICD-10-CM | POA: Diagnosis not present

## 2021-01-18 DIAGNOSIS — F419 Anxiety disorder, unspecified: Secondary | ICD-10-CM | POA: Diagnosis not present

## 2021-01-19 ENCOUNTER — Encounter: Payer: Self-pay | Admitting: Physical Therapy

## 2021-01-19 ENCOUNTER — Other Ambulatory Visit: Payer: Self-pay

## 2021-01-19 ENCOUNTER — Ambulatory Visit: Payer: Medicare Other | Admitting: Physical Therapy

## 2021-01-19 DIAGNOSIS — R279 Unspecified lack of coordination: Secondary | ICD-10-CM

## 2021-01-19 DIAGNOSIS — M6281 Muscle weakness (generalized): Secondary | ICD-10-CM

## 2021-01-19 DIAGNOSIS — R252 Cramp and spasm: Secondary | ICD-10-CM

## 2021-01-19 NOTE — Patient Instructions (Signed)
Access Code: [email or web address] URL: [email or web address] Date: 01/19/2021 Prepared by: Earlie Counts  Exercises Hooklying Transversus Abdominis Palpation - 1 x daily - 7 x weekly - 1 sets - 10 reps - 5 sec hold [facility] [address] [place], [place] [address] Phone # [phone] Fax [phone]

## 2021-01-19 NOTE — Therapy (Signed)
[facility] [address], [place], [place], [address] Phone: [phone]   Fax:  [phone]  Physical Therapy Treatment  Patient Details  Name: Carol Sanchez MRN: [id number] Date of Birth: October 30, 1950 Referring Provider (PT): Dr. Dian Queen   Encounter Date: 01/19/2021   PT End of Session - 01/19/21 1229     Visit Number 2    Authorization Type medicare/AARP    Authorization - Visit Number 2    Authorization - Number of Visits 10    PT Start Time [id number]    PT Stop Time [id number]    PT Time Calculation (min) 40 min    Activity Tolerance Patient tolerated treatment well    Behavior During Therapy [facility] for tasks assessed/performed             Past Medical History:  Diagnosis Date   Cancer ([place]) 2003   thyroid   Hypertension    Pre-diabetes    Metobolic Syndrome    Past Surgical History:  Procedure Laterality Date   BUNIONECTOMY     left foot   GASTRIC ROUX-EN-Y N/A 01/22/2018   Procedure: LAPAROSCOPIC ROUX-EN-Y GASTRIC BYPASS WITH UPPER ENDOSCOPY;  Surgeon: Johnathan Hausen, MD;  Location: [facility];  Service: General;  Laterality: N/A;   HIATAL HERNIA REPAIR N/A 01/22/2018   Procedure: LAPAROSCOPIC REPAIR OF HIATAL HERNIA;  Surgeon: Johnathan Hausen, MD;  Location: [facility];  Service: General;  Laterality: N/A;   LIPOMA EXCISION     THYROIDECTOMY  2003   TUBAL LIGATION      There were no vitals filed for this visit.   Subjective Assessment - 01/19/21 1148     Subjective The pelvic pain is gone. Last night I was up every hour.    Patient Stated Goals reduce to 2 times with night time urinating; strengthen the pelvic floor    Currently in Pain? Yes                [facility] PT Assessment - 01/19/21 0001       Palpation   SI assessment  ASIS are equal    Palpation comment no tenderness located in the left psoas                        Pelvic Floor Special Questions - 01/19/21 0001      Pelvic Floor Internal Exam Patient confirms identification and approves PT to assess pelvic floor and treatment    Exam Type Vaginal    Strength fair squeeze, definite lift   after manual work              [facility] Adult PT Treatment/Exercise - 01/19/21 0001       Self-Care   Self-Care Other Self-Care Comments    Other Self-Care Comments  discussed with patient on stopping her liquid after 8 PM      Neuro Re-ed    Neuro Re-ed Details  tactile cues to form a circular contraction with the pelvic floor and contract the lower abdomen; supine with ball squeeze, pelvic floor contraction, and abdominal contraction with tactile cues holding for 5 sec 10x      Manual Therapy   Manual Therapy Soft tissue mobilization;Joint mobilization;Internal Pelvic Floor    Joint Mobilization sacral mobilization to correct right rotation; PA and rotational mobilizaiton grade 3 fto L3-L5    Soft tissue mobilization manual work to the right SI joint    Internal Pelvic Floor manual work  to the perineal body, urethra sphincter, along the introitus                    PT Education - 01/19/21 1228     Education Details Access Code: [email or web address]    Person(s) Educated Patient    Methods Explanation;Demonstration;Verbal cues;Handout    Comprehension Returned demonstration;Verbalized understanding              PT Short Term Goals - 01/12/21 1433       PT SHORT TERM GOAL #1   Title ind with initial HEP    Time 4    Period Weeks    Status New    Target Date 02/09/21      PT SHORT TERM GOAL #2   Title understand ways to manage prolapse with laying down in middle of day, keeping bowels regular, lifting with pressure management    Time 4    Period Weeks    Status New    Target Date 02/09/21               PT Long Term Goals - 01/12/21 1434       PT LONG TERM GOAL #1   Title independent with advanced HEP for pelvic floor and understand ways for pressure management with her pilates  exericse    Time 12    Period Weeks    Status New    Target Date 04/06/21      PT LONG TERM GOAL #2   Title left lower quadrant pain with pilates decreased </= 1/10 due to pelvis in alignement    Time 12    Period Weeks    Status New    Target Date 04/06/21      PT LONG TERM GOAL #3   Title pain with lifting the left leg in a sitting position into her bed decrease </= 1/10.    Time 12    Period Weeks    Status New    Target Date 04/06/21      PT LONG TERM GOAL #4   Title pelvic floor strength is >/= 3/5 with a circular contraction with the lateral introitus contracting the same with anterior and posterior.    Baseline --    Time 12    Period Weeks      PT LONG TERM GOAL #5   Title --------                   Plan - 01/19/21 1229     Clinical Impression Statement Patient is not having pelvic pain since last visit. Her ASIS are equal. Her sacrum rotated ot the right but after therapy was in correct alignment. Pelvic floor strength is 3/5 with a circular contraction after manual work. She is now able to engage her lower abdominals correctly with pelvic floor contraction. Patient will benefit from skilled therapy to improve tissue mobility for a better pelvic floor contraction , reduction of left lower quadrant pain and prolapse management.    Personal Factors and Comorbidities Comorbidity 3+;Fitness    Comorbidities Hysteretomy wiht cystocele repair 04/07/2021; thyroid cancer; Hiatal hernia repair 01/22/2018    Examination-Activity Limitations Toileting;Continence;Transfers    Stability/Clinical Decision Making Stable/Uncomplicated    Rehab Potential Excellent    PT Frequency 1x / week    PT Duration 12 weeks    PT Treatment/Interventions ADLs/Self Care Home Management;Biofeedback;Therapeutic activities;Therapeutic exercise;Neuromuscular re-education;Patient/family education;Manual techniques    PT Next Visit Plan prolapse management;gluteus medius strength; progress  abdominals    PT Home Exercise Plan Access Code: [email or web address]    Recommended Other Services MD signed initial eval    Consulted and Agree with Plan of Care Patient             Patient will benefit from skilled therapeutic intervention in order to improve the following deficits and impairments:  Decreased coordination, Increased fascial restricitons, Decreased endurance, Decreased activity tolerance, Pain, Decreased strength, Decreased mobility  Visit Diagnosis: Muscle weakness (generalized)  Unspecified lack of coordination  Cramp and spasm     Problem List Patient Active Problem List   Diagnosis Date Noted   Roux en Y gastric bypass with repair of large type III hiatal hernia 01/22/2018   Morbid obesity with body mass index (BMI) of 45.0 to 49.9 in adult ([place]) 01/16/2018   Hiatal hernia-large type III 01/16/2018    Earlie Counts, PT 01/19/21 12:33 PM  [facility] [address], [place], [place], [address] Phone: [phone]   Fax:  [phone]  Name: CALLEE DRUSCHEL MRN: [id number] Date of Birth: 05/27/1951

## 2021-02-23 DIAGNOSIS — Z23 Encounter for immunization: Secondary | ICD-10-CM | POA: Diagnosis not present

## 2021-02-25 DIAGNOSIS — Z0000 Encounter for general adult medical examination without abnormal findings: Secondary | ICD-10-CM | POA: Diagnosis not present

## 2021-02-25 DIAGNOSIS — E7800 Pure hypercholesterolemia, unspecified: Secondary | ICD-10-CM | POA: Diagnosis not present

## 2021-02-25 DIAGNOSIS — E8881 Metabolic syndrome: Secondary | ICD-10-CM | POA: Diagnosis not present

## 2021-02-25 DIAGNOSIS — E039 Hypothyroidism, unspecified: Secondary | ICD-10-CM | POA: Diagnosis not present

## 2021-02-25 DIAGNOSIS — E559 Vitamin D deficiency, unspecified: Secondary | ICD-10-CM | POA: Diagnosis not present

## 2021-03-23 ENCOUNTER — Encounter: Payer: Self-pay | Admitting: Physical Therapy

## 2021-03-23 ENCOUNTER — Other Ambulatory Visit: Payer: Self-pay

## 2021-03-23 ENCOUNTER — Ambulatory Visit: Payer: Medicare Other | Attending: Obstetrics and Gynecology | Admitting: Physical Therapy

## 2021-03-23 DIAGNOSIS — R279 Unspecified lack of coordination: Secondary | ICD-10-CM | POA: Insufficient documentation

## 2021-03-23 DIAGNOSIS — R252 Cramp and spasm: Secondary | ICD-10-CM | POA: Insufficient documentation

## 2021-03-23 DIAGNOSIS — M6281 Muscle weakness (generalized): Secondary | ICD-10-CM | POA: Diagnosis not present

## 2021-03-23 NOTE — Patient Instructions (Signed)
Access Code: [email or web address] URL: [email or web address] Date: 03/23/2021 Prepared by: Earlie Counts  Program Notes contract the pelvic floor when you sneeze, laugh, and cough.    Exercises Hooklying Transversus Abdominis Palpation - 1 x daily - 7 x weekly - 1 sets - 10 reps - 5 sec hold Seated Pelvic Floor Contraction - 3 x daily - 7 x weekly - 1 sets - 5 reps - 10 sec hold Seated Quick Flick Pelvic Floor Contractions - 3 x daily - 7 x weekly - 1 sets - 5 reps  [facility] [address] [place], [place] [address] Phone # [phone] Fax [phone]

## 2021-03-23 NOTE — Therapy (Signed)
[place] @ [place], [place], [address] Phone: [phone]   Fax:  [phone]  Physical Therapy Treatment  Patient Details  Name: Carol Sanchez MRN: [id number] Date of Birth: 15-Dec-1950 Referring Provider (PT): Dr. Dian Queen   Encounter Date: 03/23/2021   PT End of Session - 03/23/21 1204     Visit Number 3    Date for PT Re-Evaluation 04/06/21    Authorization Type medicare/AARP    Authorization - Visit Number 3    Authorization - Number of Visits 10    PT Start Time 1100    PT Stop Time 6283    PT Time Calculation (min) 53 min    Activity Tolerance Patient tolerated treatment well    Behavior During Therapy [facility] for tasks assessed/performed             Past Medical History:  Diagnosis Date   Cancer ([place]) 2003   thyroid   Hypertension    Pre-diabetes    Metobolic Syndrome    Past Surgical History:  Procedure Laterality Date   BUNIONECTOMY     left foot   GASTRIC ROUX-EN-Y N/A 01/22/2018   Procedure: LAPAROSCOPIC ROUX-EN-Y GASTRIC BYPASS WITH UPPER ENDOSCOPY;  Surgeon: Johnathan Hausen, MD;  [facility];  Service: General;  Laterality: N/A;   HIATAL HERNIA REPAIR N/A 01/22/2018   Procedure: LAPAROSCOPIC REPAIR OF HIATAL HERNIA;  Surgeon: Johnathan Hausen, MD;  [facility];  Service: General;  Laterality: N/A;   LIPOMA EXCISION     THYROIDECTOMY  2003   TUBAL LIGATION      There were no vitals filed for this visit.   Subjective Assessment - 03/23/21 1104     Subjective I had a UTI during my vacation. I have been doing the Pilates and feel it has helped. I do not feel things are falling out. If I drink late I will get up frequently. No pelvic pain and pressure.    Patient Stated Goals reduce to 2 times with night time urinating; strengthen the pelvic floor    Currently in Pain? No/denies                            Pelvic Floor Special Questions -  03/23/21 0001     Pelvic Floor Internal Exam Patient confirms identification and approves PT to assess pelvic floor and treatment    Exam Type Vaginal    Palpation manual work around the urethra sphincter, along the sides of the bladder, along the introitus and educated patient on how to stroke the sids of the vaginal wall 30 times each side    Strength fair squeeze, definite lift   after manual work increased to 4/5 in supine; standing is 3/5              [facility] Adult PT Treatment/Exercise - 03/23/21 0001       Self-Care   Self-Care Other Self-Care Comments    Other Self-Care Comments  using a roller that is flat and spicky to massage the loeg muscles for tightness      Neuro Re-ed    Neuro Re-ed Details  how to exercise with her pilates instructor and let her know if she is not able to contract her pelvic floor, when at [place] to contract the pelvic floor while exercising      Lumbar Exercises: Stretches   Other Lumbar Stretch Exercise ankle dorsiflexor stretch  for anterior ankle tightness      Lumbar Exercises: Standing   Other Standing Lumbar Exercises standing hip flexion and abduction with pelvic floor contraction and core engagement; standing squating with pelvic floor and core engagement and while her pelvic floor may relax then reingage it.      Lumbar Exercises: Seated   Other Seated Lumbar Exercises pelvic floor contraction holding for 5 sec 5x then 5 quick flicks; contract the pelvic floor with coughing, sneezing and laughing                     PT Education - 03/23/21 1200     Education Details Access Code: [email or web address]; perineal massage; exercise at the gym with core and pelvic floor engaged    Person(s) Educated Patient    Methods Explanation;Demonstration;Verbal cues;Handout    Comprehension Verbalized understanding;Returned demonstration              PT Short Term Goals - 03/23/21 1209       PT SHORT TERM GOAL #1   Title ind with  initial HEP    Time 4    Period Weeks    Status Achieved      PT SHORT TERM GOAL #2   Title understand ways to manage prolapse with laying down in middle of day, keeping bowels regular, lifting with pressure management    Time 4    Period Weeks    Status Achieved               PT Long Term Goals - 01/12/21 1434       PT LONG TERM GOAL #1   Title independent with advanced HEP for pelvic floor and understand ways for pressure management with her pilates exericse    Time 12    Period Weeks    Status New    Target Date 04/06/21      PT LONG TERM GOAL #2   Title left lower quadrant pain with pilates decreased </= 1/10 due to pelvis in alignement    Time 12    Period Weeks    Status New    Target Date 04/06/21      PT LONG TERM GOAL #3   Title pain with lifting the left leg in a sitting position into her bed decrease </= 1/10.    Time 12    Period Weeks    Status New    Target Date 04/06/21      PT LONG TERM GOAL #4   Title pelvic floor strength is >/= 3/5 with a circular contraction with the lateral introitus contracting the same with anterior and posterior.    Baseline --    Time 12    Period Weeks      PT LONG TERM GOAL #5   Title --------                   Plan - 03/23/21 1205     Clinical Impression Statement Patient is not leaking urine. She will get up several times during the night if she drinks alot of water at night. Patient had tightness in the lateral sides of the introitus and urethra sphincter. After manual work her pelvic floor strength increased to 4/5 in supine and is 3/5 in standing. She will relax her pelvic floor after she goes into a squat position. Patient is able to feel her pelvic floor contract. She needs verbal cues to contract again after it releases. Patient was instructed on  contracting the pelvic floor and core with her pilates and Silver Sneaker exercises. Patient is not having left lower quadrant pain. Patient will benefit from  skilled therapy to improve tissue mobility for a better pelvic floor contraction and prolapse management.    Personal Factors and Comorbidities Comorbidity 3+;Fitness    Comorbidities Hysteretomy wiht cystocele repair 04/07/2021; thyroid cancer; Hiatal hernia repair 01/22/2018    Examination-Activity Limitations Toileting;Continence;Transfers    Stability/Clinical Decision Making Stable/Uncomplicated    Rehab Potential Excellent    PT Frequency 1x / week    PT Duration 12 weeks    PT Treatment/Interventions ADLs/Self Care Home Management;Biofeedback;Therapeutic activities;Therapeutic exercise;Neuromuscular re-education;Patient/family education;Manual techniques    PT Next Visit Plan review her HEP; pelvic floor contraction in standing, go over her silver sneaker exercises to assist her to contract the pelvic floor; ask about left lower quadrant pain.need a renewal to cotinue.    PT Home Exercise Plan Access Code: [email or web address]    Consulted and Agree with Plan of Care Patient             Patient will benefit from skilled therapeutic intervention in order to improve the following deficits and impairments:  Decreased coordination, Increased fascial restricitons, Decreased endurance, Decreased activity tolerance, Pain, Decreased strength, Decreased mobility  Visit Diagnosis: Muscle weakness (generalized)  Unspecified lack of coordination  Cramp and spasm     Problem List Patient Active Problem List   Diagnosis Date Noted   Roux en Y gastric bypass with repair of large type III hiatal hernia 01/22/2018   Morbid obesity with body mass index (BMI) of 45.0 to 49.9 in adult ([place]) 01/16/2018   Hiatal hernia-large type III 01/16/2018    Earlie Counts, PT 03/23/21 12:12 PM  [place] @ [place], [place], [address] Phone: [phone]   Fax:  [phone]  Name: TASMINE HIPWELL MRN: [id number] Date of Birth:  June 18, 1950

## 2021-03-30 ENCOUNTER — Encounter: Payer: Medicare Other | Admitting: Physical Therapy

## 2021-04-06 ENCOUNTER — Ambulatory Visit: Payer: Medicare Other | Admitting: Physical Therapy

## 2021-04-13 ENCOUNTER — Other Ambulatory Visit: Payer: Self-pay

## 2021-04-13 ENCOUNTER — Ambulatory Visit: Payer: Medicare Other | Attending: Obstetrics and Gynecology | Admitting: Physical Therapy

## 2021-04-13 ENCOUNTER — Encounter: Payer: Self-pay | Admitting: Physical Therapy

## 2021-04-13 DIAGNOSIS — R279 Unspecified lack of coordination: Secondary | ICD-10-CM | POA: Diagnosis not present

## 2021-04-13 DIAGNOSIS — R252 Cramp and spasm: Secondary | ICD-10-CM | POA: Insufficient documentation

## 2021-04-13 DIAGNOSIS — M6281 Muscle weakness (generalized): Secondary | ICD-10-CM | POA: Insufficient documentation

## 2021-04-13 NOTE — Therapy (Signed)
[facility] @ [place], [place], [address] Phone: [phone]   Fax:  [phone]  Physical Therapy Treatment  Patient Details  Name: Carol Sanchez MRN: [id number] Date of Birth: [birth year] Referring Provider (PT): Dr. Dian Queen   Encounter Date: 04/13/2021   PT End of Session - 04/13/21 1217     Visit Number 4    Date for PT Re-Evaluation 04/06/21    Authorization Type medicare/AARP    Authorization - Visit Number 4    Authorization - Number of Visits 10    PT Start Time 7342    PT Stop Time 8768    PT Time Calculation (min) 30 min    Activity Tolerance Patient tolerated treatment well    Behavior During Therapy [facility] for tasks assessed/performed             Past Medical History:  Diagnosis Date   Cancer ([place]) 2003   thyroid   Hypertension    Pre-diabetes    Metobolic Syndrome    Past Surgical History:  Procedure Laterality Date   BUNIONECTOMY     left foot   GASTRIC ROUX-EN-Y N/A 01/22/2018   Procedure: LAPAROSCOPIC ROUX-EN-Y GASTRIC BYPASS WITH UPPER ENDOSCOPY;  Surgeon: Johnathan Hausen, MD;  [facility];  Service: General;  Laterality: N/A;   HIATAL HERNIA REPAIR N/A 01/22/2018   Procedure: LAPAROSCOPIC REPAIR OF HIATAL HERNIA;  Surgeon: Johnathan Hausen, MD;  [facility];  Service: General;  Laterality: N/A;   LIPOMA EXCISION     THYROIDECTOMY  2003   TUBAL LIGATION      There were no vitals filed for this visit.   Subjective Assessment - 04/13/21 1151     Subjective No pain and feeling good. Last treatment has helped.    Patient Stated Goals reduce to 2 times with night time urinating; strengthen the pelvic floor    Currently in Pain? No/denies                [facility] PT Assessment - 04/13/21 0001       Assessment   Medical Diagnosis N81.89 Other female genital prolapse    Referring Provider (PT) Dr. Dian Queen    Prior Therapy none      Precautions    Precautions Other (comment)    Precaution Comments Thyroid cancer      Restrictions   Weight Bearing Restrictions No      Prior Function   Level of Independence Independent    Vocation Retired      Associate Professor   Overall Cognitive Status Within Functional Limits for tasks assessed      Posture/Postural Control   Posture/Postural Control Postural limitations    Postural Limitations Increased thoracic kyphosis      ROM / Strength   AROM / PROM / Strength AROM;PROM;Strength      PROM   Right Hip External Rotation  65    Left Hip External Rotation  60      Strength   Right Hip Extension 4/5    Right Hip ABduction 4-/5    Right Hip ADduction 5/5    Left Hip Extension 4/5    Left Hip ABduction 4-/5    Left Hip ADduction 5/5      Palpation   SI assessment  ASIS are equal    Palpation comment no tenderness located in the left psoas  Pelvic Floor Special Questions - 04/13/21 0001     Number of Pregnancies 3    Number of Vaginal Deliveries 3    Currently Sexually Active No    Urinary Leakage No    Urinary urgency No    Fecal incontinence No    Exam Type Deferred               [facility] Adult PT Treatment/Exercise - 04/13/21 0001       Exercises   Exercises Other Exercises    Other Exercises  Discussed with patient her current HEP and she understands them and no complications with them. Talked to her letting her Pilates instructor to incorporat more hip abduction and extension strength                       PT Short Term Goals - 03/23/21 1209       PT SHORT TERM GOAL #1   Title ind with initial HEP    Time 4    Period Weeks    Status Achieved      PT SHORT TERM GOAL #2   Title understand ways to manage prolapse with laying down in middle of day, keeping bowels regular, lifting with pressure management    Time 4    Period Weeks    Status Achieved               PT Long Term Goals - 04/13/21 1205        PT LONG TERM GOAL #1   Title independent with advanced HEP for pelvic floor and understand ways for pressure management with her pilates exericse    Time 12    Period Weeks    Status Achieved      PT LONG TERM GOAL #2   Title left lower quadrant pain with pilates decreased </= 1/10 due to pelvis in alignement    Time 12    Period Weeks    Status Achieved      PT LONG TERM GOAL #3   Title pain with lifting the left leg in a sitting position into her bed decrease </= 1/10.    Time 12    Period Weeks    Status Achieved      PT LONG TERM GOAL #4   Title pelvic floor strength is >/= 3/5 with a circular contraction with the lateral introitus contracting the same with anterior and posterior.    Time 12    Period Weeks    Status Achieved                   Plan - 04/13/21 1214     Clinical Impression Statement Patient is not having pain. She is not leaking any urine. Patient is able to do her HEP without difficulties. Patient pelvic floor strength is 4/5. Patient hip strength is 4/5 for extension and abduction. Patient has met all of her goals. Patient is happy with her progress.    Personal Factors and Comorbidities Comorbidity 3+;Fitness    Comorbidities Hysteretomy wiht cystocele repair 04/07/2021; thyroid cancer; Hiatal hernia repair 01/22/2018    Examination-Activity Limitations Toileting;Continence;Transfers    Stability/Clinical Decision Making Stable/Uncomplicated    Rehab Potential Excellent    PT Treatment/Interventions ADLs/Self Care Home Management;Biofeedback;Therapeutic activities;Therapeutic exercise;Neuromuscular re-education;Patient/family education;Manual techniques    PT Next Visit Plan Discharge to HEP    PT Home Exercise Plan Access Code: [email or web address]    Consulted and Agree with Plan of Care Patient  Patient will benefit from skilled therapeutic intervention in order to improve the following deficits and impairments:  Decreased coordination,  Increased fascial restricitons, Decreased endurance, Decreased activity tolerance, Pain, Decreased strength, Decreased mobility  Visit Diagnosis: Muscle weakness (generalized)  Unspecified lack of coordination  Cramp and spasm     Problem List Patient Active Problem List   Diagnosis Date Noted   Roux en Y gastric bypass with repair of large type III hiatal hernia 01/22/2018   Morbid obesity with body mass index (BMI) of 45.0 to 49.9 in adult [facility]) 01/16/2018   Hiatal hernia-large type III 01/16/2018    Earlie Counts, PT 04/13/21 12:18 PM  [facility] @ [place], [place], [address] Phone: [phone]   Fax:  [phone]  Name: Carol Sanchez MRN: [id number] Date of Birth: 06-Jan-1951 PHYSICAL THERAPY DISCHARGE SUMMARY  Visits from Start of Care: 4  Current functional level related to goals / functional outcomes: See above.    Remaining deficits: See above.    Education / Equipment: HEP   Patient agrees to discharge. Patient goals were met. Patient is being discharged due to meeting the stated rehab goals. Thank you for the referral. Earlie Counts, PT 04/13/21 12:19 PM

## 2021-05-16 DIAGNOSIS — E559 Vitamin D deficiency, unspecified: Secondary | ICD-10-CM | POA: Diagnosis not present

## 2021-05-16 DIAGNOSIS — E7800 Pure hypercholesterolemia, unspecified: Secondary | ICD-10-CM | POA: Diagnosis not present

## 2021-05-16 DIAGNOSIS — Z0000 Encounter for general adult medical examination without abnormal findings: Secondary | ICD-10-CM | POA: Diagnosis not present

## 2021-05-16 DIAGNOSIS — E039 Hypothyroidism, unspecified: Secondary | ICD-10-CM | POA: Diagnosis not present

## 2021-05-16 DIAGNOSIS — D509 Iron deficiency anemia, unspecified: Secondary | ICD-10-CM | POA: Diagnosis not present

## 2021-05-16 DIAGNOSIS — E8881 Metabolic syndrome: Secondary | ICD-10-CM | POA: Diagnosis not present

## 2021-05-17 ENCOUNTER — Other Ambulatory Visit: Payer: Self-pay | Admitting: Registered Nurse

## 2021-05-17 DIAGNOSIS — E7800 Pure hypercholesterolemia, unspecified: Secondary | ICD-10-CM

## 2021-05-31 DIAGNOSIS — R7303 Prediabetes: Secondary | ICD-10-CM | POA: Diagnosis not present

## 2021-05-31 DIAGNOSIS — R7309 Other abnormal glucose: Secondary | ICD-10-CM | POA: Diagnosis not present

## 2021-05-31 DIAGNOSIS — Z85850 Personal history of malignant neoplasm of thyroid: Secondary | ICD-10-CM | POA: Diagnosis not present

## 2021-05-31 DIAGNOSIS — E890 Postprocedural hypothyroidism: Secondary | ICD-10-CM | POA: Diagnosis not present

## 2021-06-06 DIAGNOSIS — E890 Postprocedural hypothyroidism: Secondary | ICD-10-CM | POA: Diagnosis not present

## 2021-06-06 DIAGNOSIS — Z85850 Personal history of malignant neoplasm of thyroid: Secondary | ICD-10-CM | POA: Diagnosis not present

## 2021-06-06 DIAGNOSIS — R208 Other disturbances of skin sensation: Secondary | ICD-10-CM | POA: Diagnosis not present

## 2021-06-06 DIAGNOSIS — R5383 Other fatigue: Secondary | ICD-10-CM | POA: Diagnosis not present

## 2021-06-06 DIAGNOSIS — E785 Hyperlipidemia, unspecified: Secondary | ICD-10-CM | POA: Diagnosis not present

## 2021-06-06 DIAGNOSIS — Z9884 Bariatric surgery status: Secondary | ICD-10-CM | POA: Diagnosis not present

## 2021-06-06 DIAGNOSIS — R7303 Prediabetes: Secondary | ICD-10-CM | POA: Diagnosis not present

## 2021-06-14 ENCOUNTER — Other Ambulatory Visit: Payer: Medicare Other

## 2021-07-01 DIAGNOSIS — M1711 Unilateral primary osteoarthritis, right knee: Secondary | ICD-10-CM | POA: Diagnosis not present

## 2021-07-08 DIAGNOSIS — M1711 Unilateral primary osteoarthritis, right knee: Secondary | ICD-10-CM | POA: Diagnosis not present

## 2021-07-12 DIAGNOSIS — M76821 Posterior tibial tendinitis, right leg: Secondary | ICD-10-CM | POA: Diagnosis not present

## 2021-07-12 DIAGNOSIS — M79672 Pain in left foot: Secondary | ICD-10-CM | POA: Diagnosis not present

## 2021-07-12 DIAGNOSIS — M76822 Posterior tibial tendinitis, left leg: Secondary | ICD-10-CM | POA: Diagnosis not present

## 2021-07-12 DIAGNOSIS — M79671 Pain in right foot: Secondary | ICD-10-CM | POA: Diagnosis not present

## 2021-07-15 DIAGNOSIS — M1711 Unilateral primary osteoarthritis, right knee: Secondary | ICD-10-CM | POA: Diagnosis not present

## 2021-07-26 DIAGNOSIS — M76822 Posterior tibial tendinitis, left leg: Secondary | ICD-10-CM | POA: Diagnosis not present

## 2021-07-26 DIAGNOSIS — M76821 Posterior tibial tendinitis, right leg: Secondary | ICD-10-CM | POA: Diagnosis not present

## 2021-07-26 DIAGNOSIS — Z6841 Body Mass Index (BMI) 40.0 and over, adult: Secondary | ICD-10-CM | POA: Diagnosis not present

## 2021-07-26 DIAGNOSIS — M79671 Pain in right foot: Secondary | ICD-10-CM | POA: Diagnosis not present

## 2021-07-26 DIAGNOSIS — Z124 Encounter for screening for malignant neoplasm of cervix: Secondary | ICD-10-CM | POA: Diagnosis not present

## 2021-07-26 DIAGNOSIS — M79672 Pain in left foot: Secondary | ICD-10-CM | POA: Diagnosis not present

## 2021-07-26 DIAGNOSIS — Z1272 Encounter for screening for malignant neoplasm of vagina: Secondary | ICD-10-CM | POA: Diagnosis not present

## 2021-07-26 DIAGNOSIS — Z01419 Encounter for gynecological examination (general) (routine) without abnormal findings: Secondary | ICD-10-CM | POA: Diagnosis not present

## 2021-07-29 DIAGNOSIS — M76822 Posterior tibial tendinitis, left leg: Secondary | ICD-10-CM | POA: Diagnosis not present

## 2021-07-29 DIAGNOSIS — M76821 Posterior tibial tendinitis, right leg: Secondary | ICD-10-CM | POA: Diagnosis not present

## 2021-07-29 DIAGNOSIS — M79671 Pain in right foot: Secondary | ICD-10-CM | POA: Diagnosis not present

## 2021-07-29 DIAGNOSIS — M79672 Pain in left foot: Secondary | ICD-10-CM | POA: Diagnosis not present

## 2021-08-02 DIAGNOSIS — M76822 Posterior tibial tendinitis, left leg: Secondary | ICD-10-CM | POA: Diagnosis not present

## 2021-08-02 DIAGNOSIS — M76821 Posterior tibial tendinitis, right leg: Secondary | ICD-10-CM | POA: Diagnosis not present

## 2021-08-02 DIAGNOSIS — M79672 Pain in left foot: Secondary | ICD-10-CM | POA: Diagnosis not present

## 2021-08-02 DIAGNOSIS — M79671 Pain in right foot: Secondary | ICD-10-CM | POA: Diagnosis not present

## 2021-08-05 DIAGNOSIS — M79671 Pain in right foot: Secondary | ICD-10-CM | POA: Diagnosis not present

## 2021-08-05 DIAGNOSIS — M79672 Pain in left foot: Secondary | ICD-10-CM | POA: Diagnosis not present

## 2021-08-05 DIAGNOSIS — M76822 Posterior tibial tendinitis, left leg: Secondary | ICD-10-CM | POA: Diagnosis not present

## 2021-08-05 DIAGNOSIS — M76821 Posterior tibial tendinitis, right leg: Secondary | ICD-10-CM | POA: Diagnosis not present

## 2021-08-08 DIAGNOSIS — M79671 Pain in right foot: Secondary | ICD-10-CM | POA: Diagnosis not present

## 2021-08-08 DIAGNOSIS — M76821 Posterior tibial tendinitis, right leg: Secondary | ICD-10-CM | POA: Diagnosis not present

## 2021-08-08 DIAGNOSIS — M79672 Pain in left foot: Secondary | ICD-10-CM | POA: Diagnosis not present

## 2021-08-08 DIAGNOSIS — M76822 Posterior tibial tendinitis, left leg: Secondary | ICD-10-CM | POA: Diagnosis not present

## 2021-08-10 DIAGNOSIS — M79671 Pain in right foot: Secondary | ICD-10-CM | POA: Diagnosis not present

## 2021-08-10 DIAGNOSIS — M76821 Posterior tibial tendinitis, right leg: Secondary | ICD-10-CM | POA: Diagnosis not present

## 2021-08-10 DIAGNOSIS — M76822 Posterior tibial tendinitis, left leg: Secondary | ICD-10-CM | POA: Diagnosis not present

## 2021-08-10 DIAGNOSIS — M79672 Pain in left foot: Secondary | ICD-10-CM | POA: Diagnosis not present

## 2021-08-19 ENCOUNTER — Encounter (HOSPITAL_COMMUNITY): Payer: Self-pay | Admitting: *Deleted

## 2021-08-22 DIAGNOSIS — M79671 Pain in right foot: Secondary | ICD-10-CM | POA: Diagnosis not present

## 2021-08-22 DIAGNOSIS — M76822 Posterior tibial tendinitis, left leg: Secondary | ICD-10-CM | POA: Diagnosis not present

## 2021-08-22 DIAGNOSIS — M79672 Pain in left foot: Secondary | ICD-10-CM | POA: Diagnosis not present

## 2021-08-22 DIAGNOSIS — M76821 Posterior tibial tendinitis, right leg: Secondary | ICD-10-CM | POA: Diagnosis not present

## 2021-08-25 DIAGNOSIS — M76821 Posterior tibial tendinitis, right leg: Secondary | ICD-10-CM | POA: Diagnosis not present

## 2021-08-25 DIAGNOSIS — M79671 Pain in right foot: Secondary | ICD-10-CM | POA: Diagnosis not present

## 2021-08-25 DIAGNOSIS — M79672 Pain in left foot: Secondary | ICD-10-CM | POA: Diagnosis not present

## 2021-08-25 DIAGNOSIS — M76822 Posterior tibial tendinitis, left leg: Secondary | ICD-10-CM | POA: Diagnosis not present

## 2021-08-29 DIAGNOSIS — M79671 Pain in right foot: Secondary | ICD-10-CM | POA: Diagnosis not present

## 2021-08-29 DIAGNOSIS — M76821 Posterior tibial tendinitis, right leg: Secondary | ICD-10-CM | POA: Diagnosis not present

## 2021-08-29 DIAGNOSIS — M76822 Posterior tibial tendinitis, left leg: Secondary | ICD-10-CM | POA: Diagnosis not present

## 2021-08-29 DIAGNOSIS — M79672 Pain in left foot: Secondary | ICD-10-CM | POA: Diagnosis not present

## 2021-08-31 DIAGNOSIS — M76821 Posterior tibial tendinitis, right leg: Secondary | ICD-10-CM | POA: Diagnosis not present

## 2021-08-31 DIAGNOSIS — M79671 Pain in right foot: Secondary | ICD-10-CM | POA: Diagnosis not present

## 2021-08-31 DIAGNOSIS — M76822 Posterior tibial tendinitis, left leg: Secondary | ICD-10-CM | POA: Diagnosis not present

## 2021-08-31 DIAGNOSIS — M79672 Pain in left foot: Secondary | ICD-10-CM | POA: Diagnosis not present

## 2021-09-08 DIAGNOSIS — M79672 Pain in left foot: Secondary | ICD-10-CM | POA: Diagnosis not present

## 2021-09-08 DIAGNOSIS — M76821 Posterior tibial tendinitis, right leg: Secondary | ICD-10-CM | POA: Diagnosis not present

## 2021-09-08 DIAGNOSIS — M76822 Posterior tibial tendinitis, left leg: Secondary | ICD-10-CM | POA: Diagnosis not present

## 2021-09-08 DIAGNOSIS — M79671 Pain in right foot: Secondary | ICD-10-CM | POA: Diagnosis not present

## 2021-09-13 DIAGNOSIS — M79671 Pain in right foot: Secondary | ICD-10-CM | POA: Diagnosis not present

## 2021-09-13 DIAGNOSIS — M76821 Posterior tibial tendinitis, right leg: Secondary | ICD-10-CM | POA: Diagnosis not present

## 2021-09-13 DIAGNOSIS — M79672 Pain in left foot: Secondary | ICD-10-CM | POA: Diagnosis not present

## 2021-09-13 DIAGNOSIS — M76822 Posterior tibial tendinitis, left leg: Secondary | ICD-10-CM | POA: Diagnosis not present

## 2021-09-16 DIAGNOSIS — M76821 Posterior tibial tendinitis, right leg: Secondary | ICD-10-CM | POA: Diagnosis not present

## 2021-09-16 DIAGNOSIS — M79671 Pain in right foot: Secondary | ICD-10-CM | POA: Diagnosis not present

## 2021-09-16 DIAGNOSIS — M76822 Posterior tibial tendinitis, left leg: Secondary | ICD-10-CM | POA: Diagnosis not present

## 2021-09-16 DIAGNOSIS — M79672 Pain in left foot: Secondary | ICD-10-CM | POA: Diagnosis not present

## 2021-09-20 DIAGNOSIS — M79671 Pain in right foot: Secondary | ICD-10-CM | POA: Diagnosis not present

## 2021-09-20 DIAGNOSIS — M76822 Posterior tibial tendinitis, left leg: Secondary | ICD-10-CM | POA: Diagnosis not present

## 2021-09-20 DIAGNOSIS — M79672 Pain in left foot: Secondary | ICD-10-CM | POA: Diagnosis not present

## 2021-09-20 DIAGNOSIS — M76821 Posterior tibial tendinitis, right leg: Secondary | ICD-10-CM | POA: Diagnosis not present

## 2021-09-23 ENCOUNTER — Other Ambulatory Visit: Payer: Self-pay | Admitting: Orthopaedic Surgery

## 2021-09-23 DIAGNOSIS — S93601A Unspecified sprain of right foot, initial encounter: Secondary | ICD-10-CM | POA: Diagnosis not present

## 2021-09-23 DIAGNOSIS — M19079 Primary osteoarthritis, unspecified ankle and foot: Secondary | ICD-10-CM | POA: Diagnosis not present

## 2021-09-23 DIAGNOSIS — S93401A Sprain of unspecified ligament of right ankle, initial encounter: Secondary | ICD-10-CM

## 2021-09-27 ENCOUNTER — Ambulatory Visit
Admission: RE | Admit: 2021-09-27 | Discharge: 2021-09-27 | Disposition: A | Discharge status: Home / Self Care | Payer: Medicare Other | Source: Ambulatory Visit | Attending: Orthopaedic Surgery | Admitting: Orthopaedic Surgery

## 2021-09-27 DIAGNOSIS — R600 Localized edema: Secondary | ICD-10-CM | POA: Diagnosis not present

## 2021-09-27 DIAGNOSIS — S93401A Sprain of unspecified ligament of right ankle, initial encounter: Secondary | ICD-10-CM

## 2021-09-27 DIAGNOSIS — M19071 Primary osteoarthritis, right ankle and foot: Secondary | ICD-10-CM | POA: Diagnosis not present

## 2021-09-27 DIAGNOSIS — S93601A Unspecified sprain of right foot, initial encounter: Secondary | ICD-10-CM

## 2021-10-06 DIAGNOSIS — S93401A Sprain of unspecified ligament of right ankle, initial encounter: Secondary | ICD-10-CM | POA: Diagnosis not present

## 2021-10-11 DIAGNOSIS — M8588 Other specified disorders of bone density and structure, other site: Secondary | ICD-10-CM | POA: Diagnosis not present

## 2021-10-11 DIAGNOSIS — R29890 Loss of height: Secondary | ICD-10-CM | POA: Diagnosis not present

## 2021-10-11 DIAGNOSIS — N958 Other specified menopausal and perimenopausal disorders: Secondary | ICD-10-CM | POA: Diagnosis not present

## 2021-10-20 ENCOUNTER — Other Ambulatory Visit: Payer: Self-pay | Admitting: Obstetrics and Gynecology

## 2021-10-20 DIAGNOSIS — Z1231 Encounter for screening mammogram for malignant neoplasm of breast: Secondary | ICD-10-CM

## 2021-11-02 DIAGNOSIS — M19071 Primary osteoarthritis, right ankle and foot: Secondary | ICD-10-CM | POA: Diagnosis not present

## 2021-11-14 DIAGNOSIS — E7800 Pure hypercholesterolemia, unspecified: Secondary | ICD-10-CM | POA: Diagnosis not present

## 2021-11-14 DIAGNOSIS — D509 Iron deficiency anemia, unspecified: Secondary | ICD-10-CM | POA: Diagnosis not present

## 2021-11-18 ENCOUNTER — Ambulatory Visit
Admission: RE | Admit: 2021-11-18 | Discharge: 2021-11-18 | Disposition: A | Discharge status: Home / Self Care | Payer: Medicare Other | Source: Ambulatory Visit | Attending: Obstetrics and Gynecology | Admitting: Obstetrics and Gynecology

## 2021-11-18 DIAGNOSIS — Z1231 Encounter for screening mammogram for malignant neoplasm of breast: Secondary | ICD-10-CM | POA: Diagnosis not present

## 2021-12-01 DIAGNOSIS — M19071 Primary osteoarthritis, right ankle and foot: Secondary | ICD-10-CM | POA: Diagnosis not present

## 2021-12-01 DIAGNOSIS — M25571 Pain in right ankle and joints of right foot: Secondary | ICD-10-CM | POA: Diagnosis not present

## 2022-01-04 DIAGNOSIS — H2513 Age-related nuclear cataract, bilateral: Secondary | ICD-10-CM | POA: Diagnosis not present

## 2022-01-04 DIAGNOSIS — H5202 Hypermetropia, left eye: Secondary | ICD-10-CM | POA: Diagnosis not present

## 2022-01-04 DIAGNOSIS — H53001 Unspecified amblyopia, right eye: Secondary | ICD-10-CM | POA: Diagnosis not present

## 2022-01-09 DIAGNOSIS — M19072 Primary osteoarthritis, left ankle and foot: Secondary | ICD-10-CM | POA: Diagnosis not present

## 2022-01-16 IMAGING — US US ABDOMEN LIMITED
1 series · 14 of 25 positions shown · non-contrast
Comparison: None.

CLINICAL DATA: Acute upper abdominal pain.

EXAM:
ULTRASOUND ABDOMEN LIMITED RIGHT UPPER QUADRANT

[Series 1: us abdomen limited · 14 of 99 slices shown]
[im 1/99]
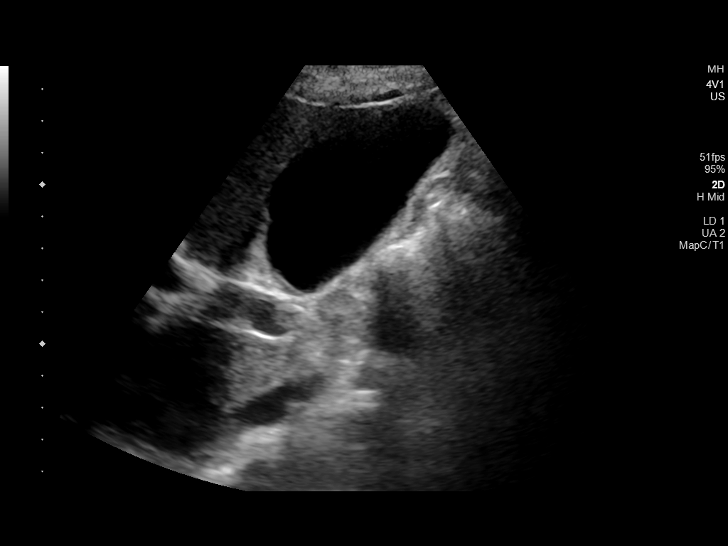
[im 9/99]
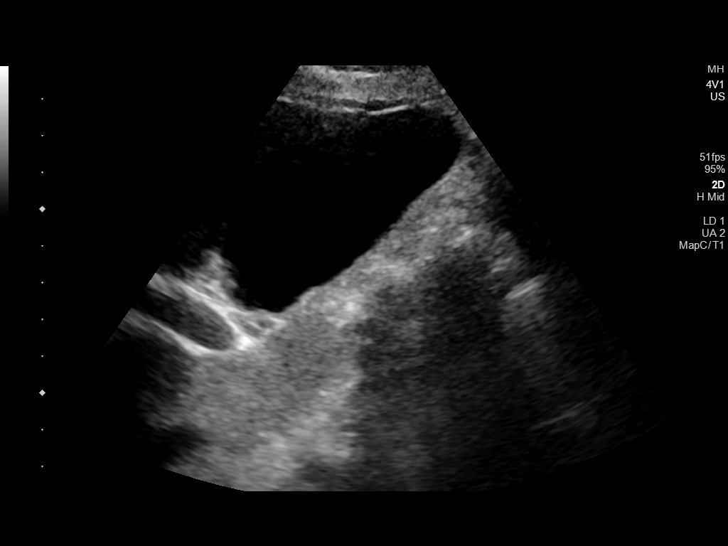
[im 17/99]
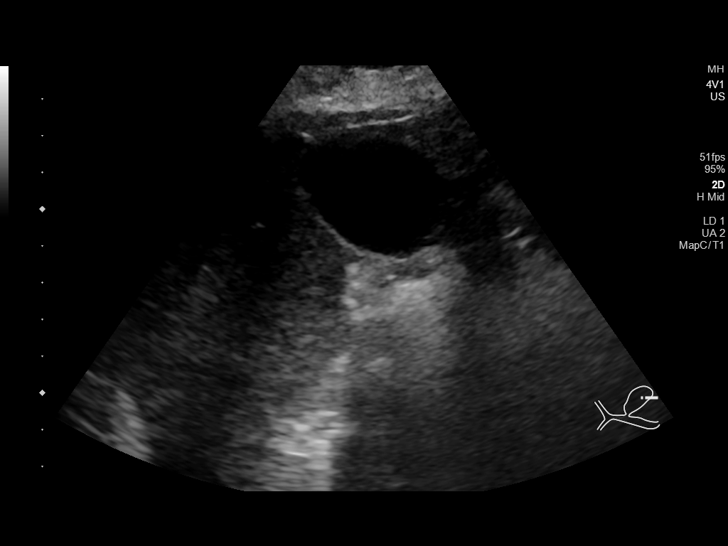
[im 25/99]
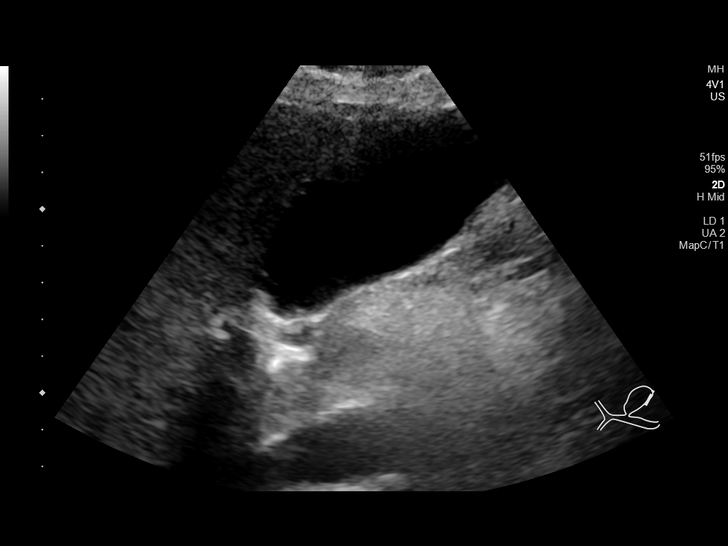
[im 33/99]
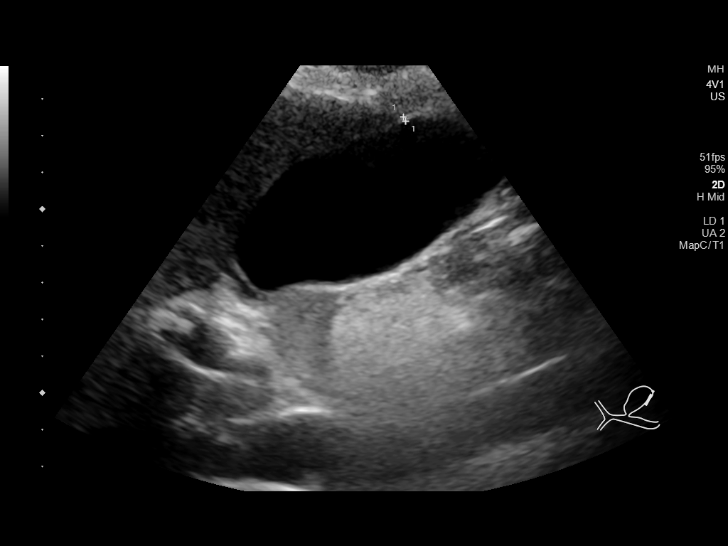
[im 37/99]
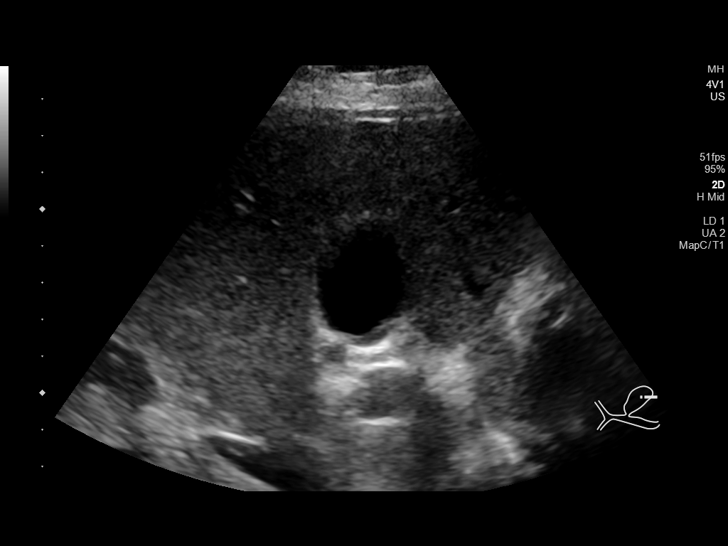
[im 45/99]
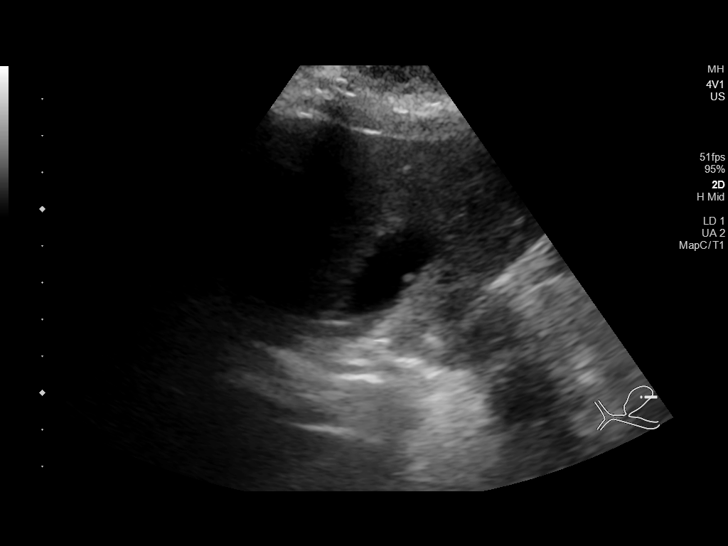
[im 54/99]
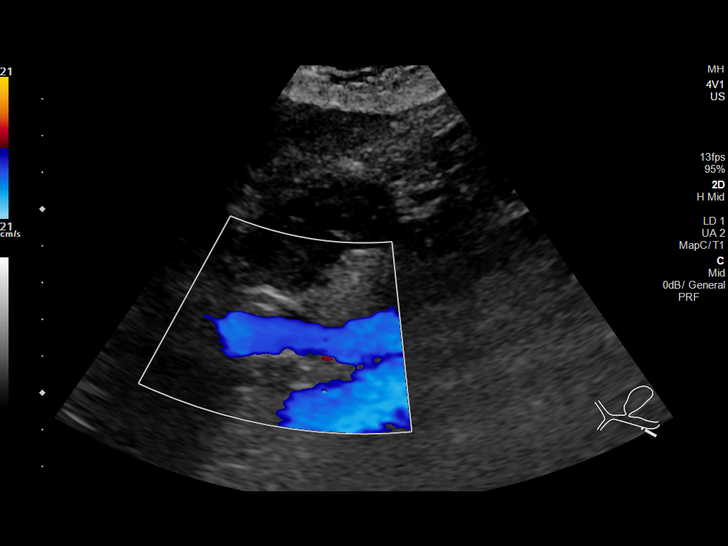
[im 62/99]
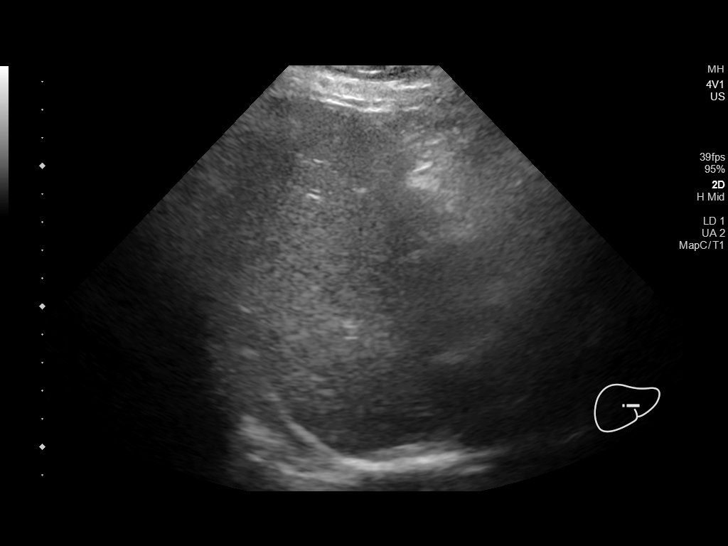
[im 66/99]
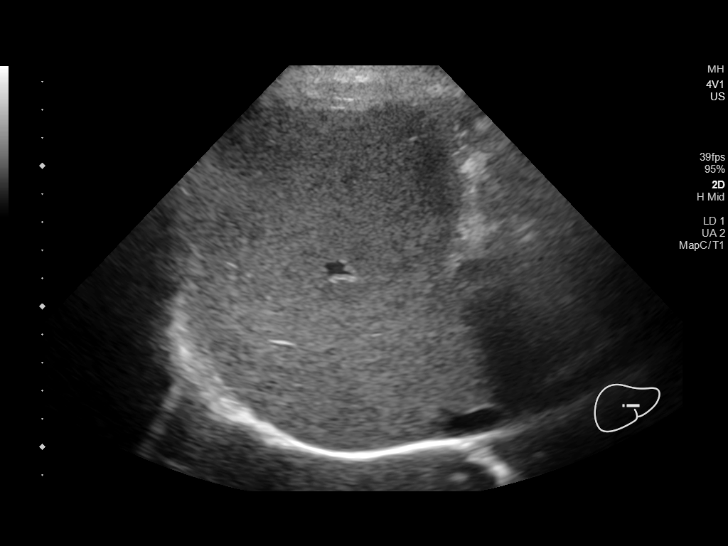
[im 74/99]
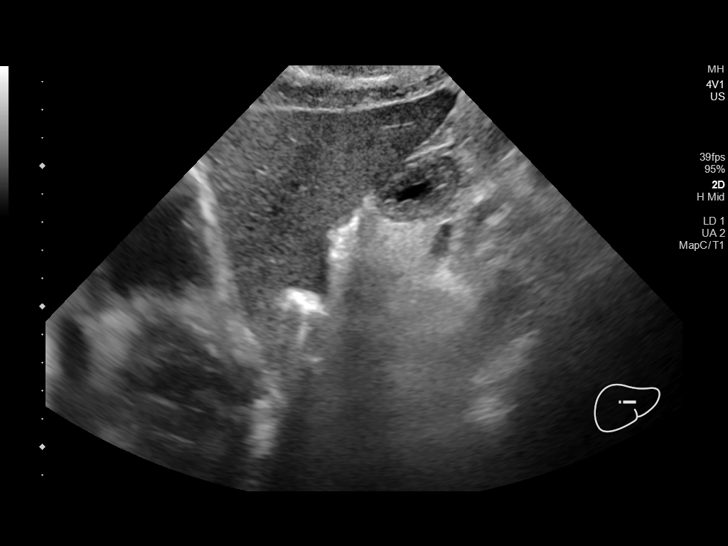
[im 82/99]
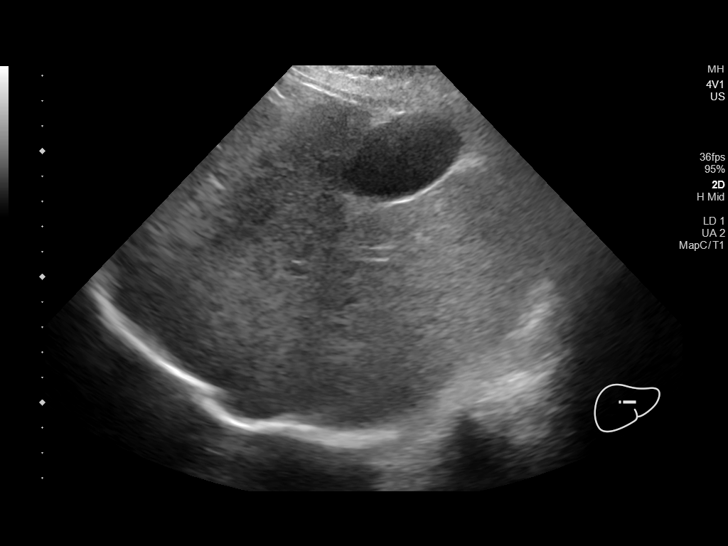
[im 90/99]
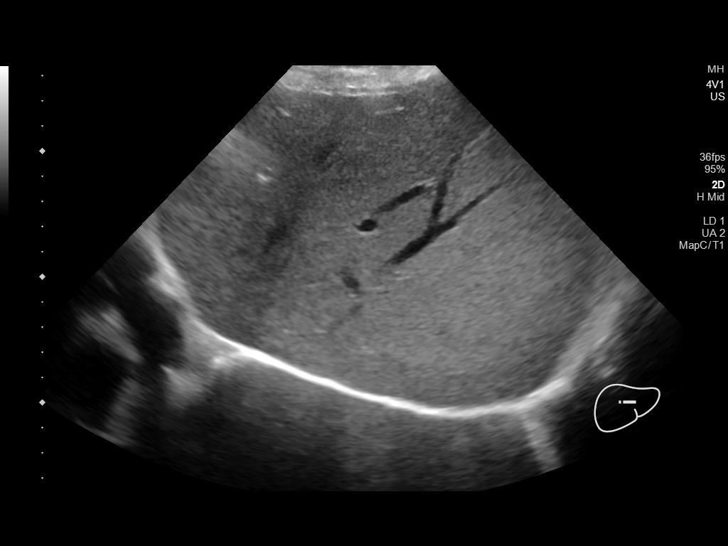
[im 99/99]
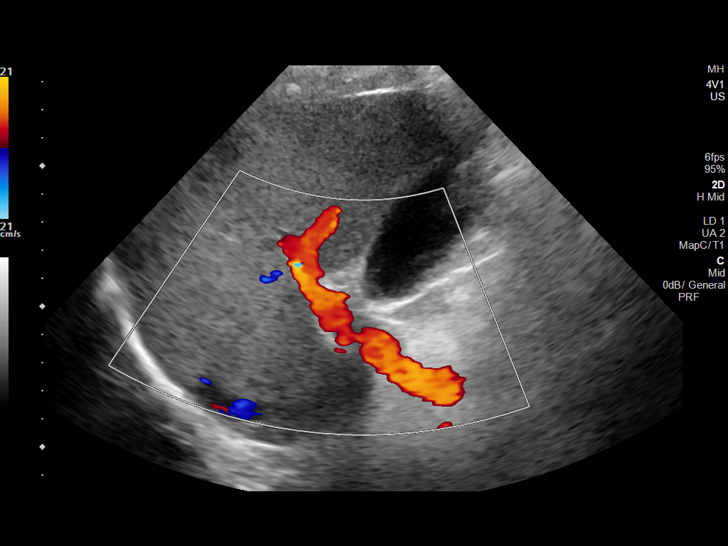

[14 of 25 positions shown; findings below may reference images not displayed]

FINDINGS: Gallbladder:

No gallstones or wall thickening visualized. No sonographic Murphy
sign noted by sonographer. Sludge is noted within the gallbladder
lumen. 2 mm polyp is noted.

Common bile duct:

Diameter: 4 mm which is within normal limits.

Liver:

No focal lesion identified. Within normal limits in parenchymal
echogenicity. Portal vein is patent on color Doppler imaging with
normal direction of blood flow towards the liver.

Other: None.
IMPRESSION: Mild amount of gallbladder sludge is noted. No other significant
abnormality seen in the right upper quadrant of the abdomen.

## 2022-01-18 DIAGNOSIS — M1711 Unilateral primary osteoarthritis, right knee: Secondary | ICD-10-CM | POA: Diagnosis not present

## 2022-01-25 DIAGNOSIS — I10 Essential (primary) hypertension: Secondary | ICD-10-CM | POA: Insufficient documentation

## 2022-01-25 DIAGNOSIS — M1711 Unilateral primary osteoarthritis, right knee: Secondary | ICD-10-CM | POA: Diagnosis not present

## 2022-01-25 DIAGNOSIS — D509 Iron deficiency anemia, unspecified: Secondary | ICD-10-CM | POA: Insufficient documentation

## 2022-01-25 DIAGNOSIS — N182 Chronic kidney disease, stage 2 (mild): Secondary | ICD-10-CM | POA: Insufficient documentation

## 2022-01-25 DIAGNOSIS — Z9884 Bariatric surgery status: Secondary | ICD-10-CM | POA: Diagnosis not present

## 2022-01-25 DIAGNOSIS — D649 Anemia, unspecified: Secondary | ICD-10-CM | POA: Insufficient documentation

## 2022-01-25 DIAGNOSIS — M722 Plantar fascial fibromatosis: Secondary | ICD-10-CM | POA: Diagnosis not present

## 2022-01-25 DIAGNOSIS — E785 Hyperlipidemia, unspecified: Secondary | ICD-10-CM | POA: Insufficient documentation

## 2022-01-25 DIAGNOSIS — E039 Hypothyroidism, unspecified: Secondary | ICD-10-CM | POA: Insufficient documentation

## 2022-01-25 DIAGNOSIS — E8881 Metabolic syndrome: Secondary | ICD-10-CM | POA: Insufficient documentation

## 2022-01-31 DIAGNOSIS — Z7184 Encounter for health counseling related to travel: Secondary | ICD-10-CM | POA: Diagnosis not present

## 2022-01-31 DIAGNOSIS — Z8249 Family history of ischemic heart disease and other diseases of the circulatory system: Secondary | ICD-10-CM | POA: Diagnosis not present

## 2022-01-31 DIAGNOSIS — Z23 Encounter for immunization: Secondary | ICD-10-CM | POA: Diagnosis not present

## 2022-01-31 DIAGNOSIS — E559 Vitamin D deficiency, unspecified: Secondary | ICD-10-CM | POA: Diagnosis not present

## 2022-01-31 DIAGNOSIS — E785 Hyperlipidemia, unspecified: Secondary | ICD-10-CM | POA: Diagnosis not present

## 2022-01-31 DIAGNOSIS — E039 Hypothyroidism, unspecified: Secondary | ICD-10-CM | POA: Diagnosis not present

## 2022-02-07 DIAGNOSIS — Z23 Encounter for immunization: Secondary | ICD-10-CM | POA: Diagnosis not present

## 2022-02-08 ENCOUNTER — Other Ambulatory Visit (HOSPITAL_BASED_OUTPATIENT_CLINIC_OR_DEPARTMENT_OTHER): Payer: Self-pay | Admitting: Registered Nurse

## 2022-02-08 DIAGNOSIS — E7800 Pure hypercholesterolemia, unspecified: Secondary | ICD-10-CM

## 2022-02-08 DIAGNOSIS — Z8249 Family history of ischemic heart disease and other diseases of the circulatory system: Secondary | ICD-10-CM

## 2022-02-15 DIAGNOSIS — Z23 Encounter for immunization: Secondary | ICD-10-CM | POA: Diagnosis not present

## 2022-02-22 ENCOUNTER — Ambulatory Visit (INDEPENDENT_AMBULATORY_CARE_PROVIDER_SITE_OTHER): Payer: Medicare Other | Admitting: Podiatry

## 2022-02-22 ENCOUNTER — Encounter: Payer: Self-pay | Admitting: Podiatry

## 2022-02-22 ENCOUNTER — Ambulatory Visit (INDEPENDENT_AMBULATORY_CARE_PROVIDER_SITE_OTHER): Payer: Medicare Other

## 2022-02-22 DIAGNOSIS — M722 Plantar fascial fibromatosis: Secondary | ICD-10-CM

## 2022-02-22 DIAGNOSIS — M21611 Bunion of right foot: Secondary | ICD-10-CM

## 2022-02-22 DIAGNOSIS — M779 Enthesopathy, unspecified: Secondary | ICD-10-CM

## 2022-02-22 MED ORDER — TRIAMCINOLONE ACETONIDE 10 MG/ML IJ SUSP
20.0000 mg | Freq: Once | INTRAMUSCULAR | Status: AC
  Administered 2022-02-22: 20 mg

## 2022-02-22 NOTE — Progress Notes (Signed)
Subjective:   Patient ID: Carol Sanchez, female   DOB: 71 y.o.   MRN: [id number]   HPI Patient presents with a lot of pain in the left foot over the right foot both in the top of the foot and in the left heel.  Both midfoot have been hurting she had 1 injection of each which only gave short relief.  The heel is making her walk differently left and she knows she has severe flatfoot deformity with significant structural bunion deformity right.  Patient does not smoke likes to be active and is getting ready to go to [place] with her 3 daughters   Review of Systems  All other systems reviewed and are negative.       Objective:  Physical Exam Vitals and nursing note reviewed.  Constitutional:      Appearance: She is well-developed.  Pulmonary:     Effort: Pulmonary effort is normal.  Musculoskeletal:        General: Normal range of motion.  Skin:    General: Skin is warm.  Neurological:     Mental Status: She is alert.     Neurovascular status intact muscle strength found to be adequate range of motion adequate with exquisite discomfort in the plantar heel region left and into the forefoot left with flatfoot deformity which is part of the pathology that she has.  Structural bunion deformity right mild deformity and pain on the right not to the same degree as left     Assessment:  Difficult to say between Planter fasciitis extensor tendinitis with the probability that 1 has changed her gait pattern and been contributory towards the other with bunion deformity significant right     Plan:  8 H&P reviewed all conditions and went ahead today and I am get a focus on the left foot.  I did find 2 separate distinct areas and I did a careful injection of the plantar fascia left and left at insertion 3 mg Kenalog 5 mg Xylocaine and for the left extensor I did explain chances for risk I went ahead and I did sterile prep and injected the extensor complex 3 mg Dexasone Kenalog 5 mg Xylocaine.   Due to so many different points of pain I did go ahead and applied air fracture walker to immobilize the left foot and I explained on the usage of this and patient will be seen back to recheck  X-rays indicate significant depression of the arch left over right with bunion deformity right of a significant nature

## 2022-03-10 ENCOUNTER — Encounter: Payer: Self-pay | Admitting: Podiatry

## 2022-03-10 ENCOUNTER — Ambulatory Visit (INDEPENDENT_AMBULATORY_CARE_PROVIDER_SITE_OTHER): Payer: Medicare Other | Admitting: Podiatry

## 2022-03-10 DIAGNOSIS — M722 Plantar fascial fibromatosis: Secondary | ICD-10-CM

## 2022-03-10 DIAGNOSIS — M21611 Bunion of right foot: Secondary | ICD-10-CM | POA: Diagnosis not present

## 2022-03-10 DIAGNOSIS — M779 Enthesopathy, unspecified: Secondary | ICD-10-CM

## 2022-03-12 NOTE — Progress Notes (Signed)
Subjective:   Patient ID: Carol Sanchez, female   DOB: 71 y.o.   MRN: [id number]   HPI Patient states she is feeling much better and is prepared to go to [place] at the current time.  Still has mild discomfort much better though than previous in the right foot is also improved   ROS      Objective:  Physical Exam  Neuro vascular status found to be intact muscle strength was found to be adequate range of motion adequate with patient found to have minimal to moderate discomfort plantar fascial band bilateral mild discomfort into the sinus tarsi and forefoot left but improved with the patient having reduced the boot at this time     Assessment:  Doing well with multiple pain points left over right with combination of steroidal medications and immobilization     Plan:  H&P reviewed condition and discussed that she still will probably need degree of immobilization and I would like for her to take anti-inflammatories around her trip to [place].  She will be seen back as needed may require more extensive treatment depending on how she does when she is out of town on her feet all the time

## 2022-03-24 ENCOUNTER — Ambulatory Visit (HOSPITAL_BASED_OUTPATIENT_CLINIC_OR_DEPARTMENT_OTHER)
Admission: RE | Admit: 2022-03-24 | Discharge: 2022-03-24 | Disposition: A | Discharge status: Home / Self Care | Payer: Medicare Other | Source: Ambulatory Visit | Attending: Registered Nurse | Admitting: Registered Nurse

## 2022-03-24 DIAGNOSIS — Z8249 Family history of ischemic heart disease and other diseases of the circulatory system: Secondary | ICD-10-CM | POA: Insufficient documentation

## 2022-03-24 DIAGNOSIS — E7800 Pure hypercholesterolemia, unspecified: Secondary | ICD-10-CM | POA: Insufficient documentation

## 2022-04-25 NOTE — Progress Notes (Addendum)
No chief complaint on file.  History of Present Illness:71 yo female with history of thyroid cancer, HTN and aortic calcification who is here today as a new consult, referred by Dr. ***, for the evaluation of an abnormal coronary calcium score of 17.4 by CT scan on 03/24/22. She tells me today that she ***  Primary Care Physician: Deland Pretty, MD   Past Medical History:  Diagnosis Date   Aortic calcification ([place])    Cancer ([place]) 2003   thyroid   Hypertension    Pre-diabetes    Metobolic Syndrome    Past Surgical History:  Procedure Laterality Date   BUNIONECTOMY     left foot   GASTRIC ROUX-EN-Y N/A 01/22/2018   Procedure: LAPAROSCOPIC ROUX-EN-Y GASTRIC BYPASS WITH UPPER ENDOSCOPY;  Surgeon: Johnathan Hausen, MD;  Location: [facility];  Service: General;  Laterality: N/A;   HIATAL HERNIA REPAIR N/A 01/22/2018   Procedure: LAPAROSCOPIC REPAIR OF HIATAL HERNIA;  Surgeon: Johnathan Hausen, MD;  Location: [facility];  Service: General;  Laterality: N/A;   LIPOMA EXCISION     THYROIDECTOMY  2003   TUBAL LIGATION      Current Outpatient Medications  Medication Sig Dispense Refill   acetaminophen (TYLENOL) 500 MG tablet Take 1,000 mg by mouth 2 (two) times daily as needed for headache.     Calcium Carbonate-Vitamin D (CALCIUM-D PO) Take 1 tablet by mouth 2 (two) times daily.     diclofenac sodium (VOLTAREN) 1 % GEL Apply 1 application topically daily as needed (pain).     EPINEPHrine (EPIPEN JR) 0.15 MG/0.3ML injection Inject 0.15 mg into the muscle as needed for anaphylaxis.   1   hydrochlorothiazide (HYDRODIURIL) 25 MG tablet Take 25 mg by mouth daily.     Multiple Vitamin (MULTIVITAMIN WITH MINERALS) TABS tablet Take 1 tablet by mouth daily.     SYNTHROID 112 MCG tablet Take 112 mcg by mouth every morning.     zolpidem (AMBIEN) 10 MG tablet Take 2.5 mg by mouth at bedtime as needed for sleep.  1   No current facility-administered medications for this visit.    Allergies  Allergen  Reactions   Shellfish Allergy Hives   Cephalexin Itching    Other reaction(s): Flushing   Penicillins Rash    Has patient had a PCN reaction causing immediate rash, facial/tongue/throat swelling, SOB or lightheadedness with hypotension: Yes Has patient had a PCN reaction causing severe rash involving mucus membranes or skin necrosis: No Has patient had a PCN reaction that required hospitalization: No Has patient had a PCN reaction occurring within the last 10 years: No If all of the above answers are "NO", then may proceed with Cephalosporin use.    Sulfa Antibiotics Rash   Atorvastatin     Other reaction(s): muscle aches   Statins     Other reaction(s): muscle aches    Social History   Socioeconomic History   Marital status: Divorced    Spouse name: Not on file   Number of children: Not on file   Years of education: Not on file   Highest education level: Not on file  Occupational History   Not on file  Tobacco Use   Smoking status: Former    Types: Cigarettes    Quit date: 06/23/1975    Years since quitting: 46.8   Smokeless tobacco: Never  Vaping Use   Vaping Use: Never used  Substance and Sexual Activity   Alcohol use: Yes    Comment: socially  Drug use: Never   Sexual activity: Not on file  Other Topics Concern   Not on file  Social History Narrative   Not on file   Social Determinants of Health   Financial Resource Strain: Not on file  Food Insecurity: No Food Insecurity (05/28/2017)   Hunger Vital Sign    Worried About Running Out of Food in the Last Year: Never true    Ran Out of Food in the Last Year: Never true  Transportation Needs: Not on file  Physical Activity: Not on file  Stress: Not on file  Social Connections: Not on file  Intimate Partner Violence: Not on file    Family History  Problem Relation Age of Onset   Hypertension Other    Heart disease Other     Review of Systems:  As stated in the HPI and otherwise negative.   There were  no vitals taken for this visit.  Physical Examination: General: Well developed, well nourished, NAD  HEENT: OP clear, mucus membranes moist  SKIN: warm, dry. No rashes. Neuro: No focal deficits  Musculoskeletal: Muscle strength 5/5 all ext  Psychiatric: Mood and affect normal  Neck: No JVD, no carotid bruits, no thyromegaly, no lymphadenopathy.  Lungs:Clear bilaterally, no wheezes, rhonci, crackles Cardiovascular: Regular rate and rhythm. No murmurs, gallops or rubs. Abdomen:Soft. Bowel sounds present. Non-tender.  Extremities: No lower extremity edema. Pulses are 2 + in the bilateral DP/PT.  EKG:  EKG {ACTION; IS/IS [id number]} ordered today. The ekg ordered today demonstrates ***  Recent Labs: No results found for requested labs within last 365 days.   Lipid Panel No results found for: "CHOL", "TRIG", "HDL", "CHOLHDL", "VLDL", "LDLCALC", "LDLDIRECT"   Wt Readings from Last 3 Encounters:  12/22/20 208 lb (94.3 kg)  11/24/20 203 lb (92.1 kg)  07/10/18 212 lb 9.6 oz (96.4 kg)      Assessment and Plan:   1.   Labs/ tests ordered today include:  No orders of the defined types were placed in this encounter.    Disposition:   F/U with me in ***    Signed, Lauree Chandler, MD, [facility] 04/25/2022 9:11 PM    [facility] [address], [place], [place]  [address] Phone: [phone]; Fax: [phone]

## 2022-04-26 ENCOUNTER — Encounter: Payer: Self-pay | Admitting: Cardiovascular Disease

## 2022-04-26 ENCOUNTER — Ambulatory Visit: Payer: Medicare Other | Attending: Cardiovascular Disease | Admitting: Cardiovascular Disease

## 2022-04-26 VITALS — BP 140/100 | HR 77 | Ht 60.0 in | Wt 217.4 lb

## 2022-04-26 DIAGNOSIS — I2510 Atherosclerotic heart disease of native coronary artery without angina pectoris: Secondary | ICD-10-CM | POA: Diagnosis not present

## 2022-04-26 DIAGNOSIS — I350 Nonrheumatic aortic (valve) stenosis: Secondary | ICD-10-CM | POA: Diagnosis not present

## 2022-04-26 MED ORDER — ASPIRIN 81 MG PO TBEC: 81.0000 mg | DELAYED_RELEASE_TABLET | Freq: Every day | ORAL | 3 refills | Status: AC

## 2022-04-26 MED ORDER — PRAVASTATIN SODIUM 20 MG PO TABS: 20.0000 mg | ORAL_TABLET | Freq: Every evening | ORAL | 3 refills | Status: DC

## 2022-04-26 NOTE — Patient Instructions (Signed)
Medication Instructions:  Your physician has recommended you make the following change in your medication:  1.) start aspirin 81 mg - take one tablet daily 2.) start pravastatin (Pravachol) 20 mg - take one tablet daily  *If you need a refill on your cardiac medications before your next appointment, please call your pharmacy*   Lab Work: None today   Testing/Procedures: Your physician has requested that you have an echocardiogram. Echocardiography is a painless test that uses sound waves to create images of your heart. It provides your doctor with information about the size and shape of your heart and how well your heart's chambers and valves are working. This procedure takes approximately one hour. There are no restrictions for this procedure. Please do NOT wear cologne, perfume, aftershave, or lotions (deodorant is allowed). Please arrive 15 minutes prior to your appointment time.   Follow-Up: At [facility], you and your health needs are our priority.  As part of our continuing mission to provide you with exceptional heart care, we have created designated Provider Care Teams.  These Care Teams include your primary Cardiologist (physician) and Advanced Practice Providers (APPs -  Physician Assistants and Nurse Practitioners) who all work together to provide you with the care you need, when you need it.   Your next appointment:   6-8 week(s)  The format for your next appointment:   In Person  Provider:   Lauree Chandler, MD   Important Information About Sugar

## 2022-05-15 ENCOUNTER — Encounter: Payer: Self-pay | Admitting: Podiatry

## 2022-05-15 ENCOUNTER — Ambulatory Visit (INDEPENDENT_AMBULATORY_CARE_PROVIDER_SITE_OTHER): Payer: Medicare Other | Admitting: Podiatry

## 2022-05-15 DIAGNOSIS — M778 Other enthesopathies, not elsewhere classified: Secondary | ICD-10-CM | POA: Diagnosis not present

## 2022-05-15 MED ORDER — TRIAMCINOLONE ACETONIDE 10 MG/ML IJ SUSP
20.0000 mg | Freq: Once | INTRAMUSCULAR | Status: AC
  Administered 2022-05-15: 20 mg

## 2022-05-16 ENCOUNTER — Ambulatory Visit (HOSPITAL_COMMUNITY)
Admission: RE | Admit: 2022-05-16 | Discharge: 2022-05-16 | Disposition: A | Discharge status: Home / Self Care | Payer: Medicare Other | Source: Ambulatory Visit | Attending: Cardiovascular Disease | Admitting: Cardiovascular Disease

## 2022-05-16 DIAGNOSIS — I350 Nonrheumatic aortic (valve) stenosis: Secondary | ICD-10-CM

## 2022-05-16 DIAGNOSIS — I119 Hypertensive heart disease without heart failure: Secondary | ICD-10-CM | POA: Insufficient documentation

## 2022-05-16 DIAGNOSIS — I2510 Atherosclerotic heart disease of native coronary artery without angina pectoris: Secondary | ICD-10-CM | POA: Diagnosis not present

## 2022-05-16 DIAGNOSIS — I352 Nonrheumatic aortic (valve) stenosis with insufficiency: Secondary | ICD-10-CM | POA: Insufficient documentation

## 2022-05-16 LAB — ECHOCARDIOGRAM COMPLETE
AR max vel: 1.2 cm2
AV Area VTI: 1.14 cm2
AV Area mean vel: 1.15 cm2
AV Mean grad: 20 mmHg
AV Peak grad: 33.2 mmHg
Ao pk vel: 2.88 m/s
Area-P 1/2: 3.42 cm2
MV M vel: 5.34 m/s
MV Peak grad: 114.1 mmHg
P 1/2 time: 465 msec
S' Lateral: 2.8 cm

## 2022-05-16 NOTE — Progress Notes (Signed)
Subjective:   Patient ID: Carol Sanchez, female   DOB: 71 y.o.   MRN: [id number]   HPI Patient states she did well for several months but is started develop pain again on top of both feet and states that the left is worse than the right   ROS      Objective:  Physical Exam  Neuro vascular status intact significant flatfoot deformity noted bilateral inflammation pain of the midtarsal joint and into the extensor tendon complex bilateral that is painful when pressed     Assessment:  Chronic tendinitis of the dorsal tendon complex bilateral     Plan:  H&P reviewed condition explained that surgical intervention for this would probably be difficult in regard to try to continue conservative and I went ahead today did sterile prep and injected the tendon complex 3 mg Dexasone Kenalog 5 mg Xylocaine after explaining risk.  Can use topical medicines oral and ice as needed and reappoint as symptoms indicate we may need to get CT scans or MRIs in the future

## 2022-05-17 DIAGNOSIS — E119 Type 2 diabetes mellitus without complications: Secondary | ICD-10-CM | POA: Diagnosis not present

## 2022-05-17 DIAGNOSIS — Z0000 Encounter for general adult medical examination without abnormal findings: Secondary | ICD-10-CM | POA: Diagnosis not present

## 2022-05-17 DIAGNOSIS — E039 Hypothyroidism, unspecified: Secondary | ICD-10-CM | POA: Diagnosis not present

## 2022-05-17 DIAGNOSIS — E7800 Pure hypercholesterolemia, unspecified: Secondary | ICD-10-CM | POA: Diagnosis not present

## 2022-05-19 ENCOUNTER — Telehealth: Payer: Self-pay | Admitting: *Deleted

## 2022-05-19 DIAGNOSIS — I350 Nonrheumatic aortic (valve) stenosis: Secondary | ICD-10-CM

## 2022-05-19 DIAGNOSIS — I2510 Atherosclerotic heart disease of native coronary artery without angina pectoris: Secondary | ICD-10-CM

## 2022-05-19 MED ORDER — METOPROLOL TARTRATE 100 MG PO TABS: 100.0000 mg | ORAL_TABLET | Freq: Once | ORAL | 0 refills | Status: DC

## 2022-05-19 NOTE — Telephone Encounter (Signed)
Spoke w patient and reviewed echo results.  She voices understanding and is in agreement to have the coronary CTA.  Reviewed instructions over the phone and sent them through the portal.  Her labs are current and available in [facility].

## 2022-05-19 NOTE — Telephone Encounter (Signed)
-----   Message from Burnell Blanks, MD sent at 05/17/2022 10:52 AM EST ----- Her heart is strong. Her aortic valve is abnormal. 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 The valve has moderate stenosis (low side of moderate) and mild leakiness. Her mitral valve has mild leakiness. No changes right now. We will need to schedule her coronary CTA as outlined in my office note if she is willing. Thanks, Gerald Stabs

## 2022-05-24 ENCOUNTER — Telehealth: Payer: Self-pay | Admitting: Cardiovascular Disease

## 2022-05-24 DIAGNOSIS — Z01810 Encounter for preprocedural cardiovascular examination: Secondary | ICD-10-CM

## 2022-05-24 DIAGNOSIS — I350 Nonrheumatic aortic (valve) stenosis: Secondary | ICD-10-CM | POA: Diagnosis not present

## 2022-05-24 DIAGNOSIS — E039 Hypothyroidism, unspecified: Secondary | ICD-10-CM | POA: Diagnosis not present

## 2022-05-24 DIAGNOSIS — E785 Hyperlipidemia, unspecified: Secondary | ICD-10-CM | POA: Diagnosis not present

## 2022-05-24 DIAGNOSIS — D72829 Elevated white blood cell count, unspecified: Secondary | ICD-10-CM | POA: Diagnosis not present

## 2022-05-24 DIAGNOSIS — Z0000 Encounter for general adult medical examination without abnormal findings: Secondary | ICD-10-CM | POA: Diagnosis not present

## 2022-05-24 DIAGNOSIS — Z8249 Family history of ischemic heart disease and other diseases of the circulatory system: Secondary | ICD-10-CM | POA: Diagnosis not present

## 2022-05-24 DIAGNOSIS — E559 Vitamin D deficiency, unspecified: Secondary | ICD-10-CM | POA: Diagnosis not present

## 2022-05-24 NOTE — Telephone Encounter (Signed)
Burnell Blanks, MD  Rodman Key, RN Her heart is strong. Her aortic valve is abnormal. 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 The valve has moderate stenosis (low side of moderate) and mild leakiness. Her mitral valve has mild leakiness. No changes right now. We will need to schedule her coronary CTA as outlined in my office note if she is willing. Thanks, [employer] and spoke with pt who is now scheduled for 05/30/22. She understands the need for BMET prior and is coming by office for lab draw tomorrow 05/25/22. Order placed at this time.

## 2022-05-24 NOTE — Telephone Encounter (Signed)
-----   Message from [place] sent at 05/23/2022  4:11 PM EST ----- Regarding: labs Hey,  I just scheduled her for 05/30/22 and she will need labs done..   Thanks, [place]

## 2022-05-25 ENCOUNTER — Ambulatory Visit: Payer: Medicare Other | Attending: Cardiovascular Disease

## 2022-05-25 DIAGNOSIS — Z01810 Encounter for preprocedural cardiovascular examination: Secondary | ICD-10-CM

## 2022-05-25 LAB — BASIC METABOLIC PANEL
BUN/Creatinine Ratio: 28 (ref 12–28)
BUN: 22 mg/dL (ref 8–27)
CO2: 28 mmol/L (ref 20–29)
Calcium: 9.3 mg/dL (ref 8.7–10.3)
Chloride: 99 mmol/L (ref 96–106)
Creatinine, Ser: 0.79 mg/dL (ref 0.57–1.00)
Glucose: 72 mg/dL (ref 70–99)
Potassium: 4.1 mmol/L (ref 3.5–5.2)
Sodium: 139 mmol/L (ref 134–144)
eGFR: 80 mL/min/{1.73_m2} (ref >59)

## 2022-05-26 ENCOUNTER — Telehealth (HOSPITAL_COMMUNITY): Payer: Self-pay | Admitting: *Deleted

## 2022-05-26 NOTE — Telephone Encounter (Signed)
Attempted to call patient regarding upcoming cardiac CT appointment. °Left message on voicemail with name and callback number ° °Karine Garn RN Navigator Cardiac Imaging °[facility] °[phone] Office °[phone] Cell ° °

## 2022-05-30 ENCOUNTER — Ambulatory Visit (HOSPITAL_COMMUNITY)
Admission: RE | Admit: 2022-05-30 | Discharge: 2022-05-30 | Disposition: A | Discharge status: Home / Self Care | Payer: Medicare Other | Source: Ambulatory Visit | Attending: Cardiovascular Disease | Admitting: Cardiovascular Disease

## 2022-05-30 DIAGNOSIS — I350 Nonrheumatic aortic (valve) stenosis: Secondary | ICD-10-CM | POA: Insufficient documentation

## 2022-05-30 DIAGNOSIS — I2510 Atherosclerotic heart disease of native coronary artery without angina pectoris: Secondary | ICD-10-CM | POA: Diagnosis not present

## 2022-05-30 MED ORDER — NITROGLYCERIN 0.4 MG SL SUBL
0.8000 mg | SUBLINGUAL_TABLET | Freq: Once | SUBLINGUAL | Status: AC
  Administered 2022-05-30: 0.8 mg via SUBLINGUAL

## 2022-05-30 MED ORDER — IOHEXOL 350 MG/ML SOLN
100.0000 mL | Freq: Once | INTRAVENOUS | Status: AC | PRN
  Administered 2022-05-30: 100 mL via INTRAVENOUS

## 2022-05-30 MED ORDER — NITROGLYCERIN 0.4 MG SL SUBL
SUBLINGUAL_TABLET | SUBLINGUAL | Status: AC
  Filled 2022-05-30: qty 2

## 2022-06-16 ENCOUNTER — Encounter: Payer: Self-pay | Admitting: Cardiovascular Disease

## 2022-06-16 ENCOUNTER — Ambulatory Visit: Payer: Medicare Other | Attending: Cardiovascular Disease | Admitting: Cardiovascular Disease

## 2022-06-16 VITALS — BP 138/86 | HR 91 | Ht 60.0 in | Wt 218.8 lb

## 2022-06-16 DIAGNOSIS — I2510 Atherosclerotic heart disease of native coronary artery without angina pectoris: Secondary | ICD-10-CM | POA: Insufficient documentation

## 2022-06-16 DIAGNOSIS — I340 Nonrheumatic mitral (valve) insufficiency: Secondary | ICD-10-CM | POA: Diagnosis not present

## 2022-06-16 DIAGNOSIS — I350 Nonrheumatic aortic (valve) stenosis: Secondary | ICD-10-CM | POA: Insufficient documentation

## 2022-06-16 NOTE — Progress Notes (Signed)
Chief Complaint  Patient presents with   Follow-up    CAD, aortic stenosis   History of Present Illness:72 yo female with history of thyroid cancer, HTN and aortic calcification who is here today for follow up. I saw her as a new consult in November 2023 for the evaluation of an abnormal coronary calcium score of 17.4 by CT scan on 03/24/22. She has had a gastric bypass surgery. She has mild carotid artery disease and did not tolerate low dose Crestor. She had tolerated Pravastatin in the past. She reported a cardiac murmur for years. Her aortic valve calcium score on the CT was over 1000. She reported dyspnea with moderate exertion. No chest pain or LE edema. She is a retired NP. I took care of her mother Gale Journey who died 3 years ago. Echo May 31, 2022 with LVEF=60-65%, mild MR, mild to moderate AS (mean gradient 20 mmHg), mild AI. Coronary CTA 05/30/22 with minimal coronary plaque (less than 20% proximal LAD stenosis), calcium score of 6. Ascending aorta normal size.   She is here today for follow up. The patient denies any chest pain, dyspnea, palpitations, lower extremity edema, orthopnea, PND, dizziness, near syncope or syncope.   Primary Care Physician: Holland Commons, FNP Janie Morning, MD)  Past Medical History:  Diagnosis Date   Aortic calcification ([place])    Cancer [facility]) 2003   thyroid   Carotid artery disease ([place])    Hypertension    Pre-diabetes    Metobolic Syndrome    Past Surgical History:  Procedure Laterality Date   ABDOMINAL HYSTERECTOMY     BUNIONECTOMY     left foot   GASTRIC ROUX-EN-Y N/A 01/22/2018   Procedure: LAPAROSCOPIC ROUX-EN-Y GASTRIC BYPASS WITH UPPER ENDOSCOPY;  Surgeon: Johnathan Hausen, MD;  Location: [facility];  Service: General;  Laterality: N/A;   HIATAL HERNIA REPAIR N/A 01/22/2018   Procedure: LAPAROSCOPIC REPAIR OF HIATAL HERNIA;  Surgeon: Johnathan Hausen, MD;  Location: [facility];  Service: General;  Laterality: N/A;   LIPOMA EXCISION      THYROIDECTOMY  2003   TUBAL LIGATION      Current Outpatient Medications  Medication Sig Dispense Refill   acetaminophen (TYLENOL) 500 MG tablet Take 1,000 mg by mouth 2 (two) times daily as needed for headache.     aspirin EC 81 MG tablet Take 1 tablet (81 mg total) by mouth daily. Swallow whole. 90 tablet 3   Calcium Carbonate-Vitamin D (CALCIUM-D PO) Take 1 tablet by mouth 2 (two) times daily.     diclofenac sodium (VOLTAREN) 1 % GEL Apply 1 application topically daily as needed (pain).     EPINEPHrine 0.3 mg/0.3 mL IJ SOAJ injection Inject 0.3 mg into the muscle as needed for anaphylaxis.     hydrochlorothiazide (HYDRODIURIL) 25 MG tablet Take 12.5 mg by mouth daily.     Multiple Vitamin (MULTIVITAMIN WITH MINERALS) TABS tablet Take 1 tablet by mouth daily.     pravastatin (PRAVACHOL) 20 MG tablet Take 1 tablet (20 mg total) by mouth every evening. 90 tablet 3   SYNTHROID 112 MCG tablet Take 112 mcg by mouth every morning.     zolpidem (AMBIEN) 10 MG tablet Take 2.5 mg by mouth at bedtime as needed for sleep.  1   No current facility-administered medications for this visit.    Allergies  Allergen Reactions   Shellfish Allergy Hives   Cephalexin Itching    Other reaction(s): Flushing   Penicillins Rash    Has patient  had a PCN reaction causing immediate rash, facial/tongue/throat swelling, SOB or lightheadedness with hypotension: Yes Has patient had a PCN reaction causing severe rash involving mucus membranes or skin necrosis: No Has patient had a PCN reaction that required hospitalization: No Has patient had a PCN reaction occurring within the last 10 years: No If all of the above answers are "NO", then may proceed with Cephalosporin use.    Sulfa Antibiotics Rash   Atorvastatin     Other reaction(s): muscle aches   Statins     Other reaction(s): muscle aches    Social History   Socioeconomic History   Marital status: Divorced    Spouse name: Not on file   Number of  children: 3   Years of education: Not on file   Highest education level: Not on file  Occupational History   Occupation: Retired NP  Tobacco Use   Smoking status: Former    Types: Cigarettes    Quit date: 06/23/1975    Years since quitting: 47.0   Smokeless tobacco: Never  Vaping Use   Vaping Use: Never used  Substance and Sexual Activity   Alcohol use: Yes    Comment: socially   Drug use: Never   Sexual activity: Not on file  Other Topics Concern   Not on file  Social History Narrative   Not on file   Social Determinants of Health   Financial Resource Strain: Not on file  Food Insecurity: No Food Insecurity (05/28/2017)   Hunger Vital Sign    Worried About Running Out of Food in the Last Year: Never true    Ran Out of Food in the Last Year: Never true  Transportation Needs: Not on file  Physical Activity: Not on file  Stress: Not on file  Social Connections: Not on file  Intimate Partner Violence: Not on file    Family History  Problem Relation Age of Onset   CAD Mother    Heart attack Father    Hypertension Other    Heart disease Other     Review of Systems:  As stated in the HPI and otherwise negative.   BP 138/86   Pulse 91   Ht 5' (1.524 m)   Wt 99.2 kg   SpO2 97%   BMI 42.73 kg/m   Physical Examination: General: Well developed, well nourished, NAD  HEENT: OP clear, mucus membranes moist  SKIN: warm, dry. No rashes. Neuro: No focal deficits  Musculoskeletal: Muscle strength 5/5 all ext  Psychiatric: Mood and affect normal  Neck: No JVD, no carotid bruits, no thyromegaly, no lymphadenopathy.  Lungs:Clear bilaterally, no wheezes, rhonci, crackles Cardiovascular: Regular rate and rhythm. No murmurs, gallops or rubs. Abdomen:Soft. Bowel sounds present. Non-tender.  Extremities: No lower extremity edema. Pulses are 2 + in the bilateral DP/PT.  EKG:  EKG is not ordered today. The ekg ordered today demonstrates   Echo 05/16/22: 1. Left ventricular  ejection fraction, by estimation, is 60 to 65%. The  left ventricle has normal function. The left ventricle has no regional  wall motion abnormalities. There is mild asymmetric left ventricular  hypertrophy of the basal-septal segment.  Left ventricular diastolic parameters are indeterminate.   2. Right ventricular systolic function is normal. The right ventricular  size is normal. There is normal pulmonary artery systolic pressure.   3. The mitral valve is normal in structure. Mild mitral valve  regurgitation. No evidence of mitral stenosis.   4. Visually appears tricuspid, but may be functionally bicuspid  as there  is leaflet restriction of the RCC and LCC. The aortic valve has an  indeterminant number of cusps. Aortic valve regurgitation is mild.  Moderate aortic valve stenosis. Aortic  regurgitation PHT measures 465 msec. Aortic valve area, by VTI measures  1.14 cm. Aortic valve mean gradient measures 20.0 mmHg. Aortic valve Vmax  measures 2.88 m/s.   5. Aortic dilatation noted. There is borderline dilatation of the  ascending aorta, measuring 39 mm.   6. The inferior vena cava is normal in size with greater than 50%  respiratory variability, suggesting right atrial pressure of 3 mmHg.   Recent Labs: 05/25/2022: BUN 22; Creatinine, Ser 0.79; Potassium 4.1; Sodium 139   Lipid Panel No results found for: "CHOL", "TRIG", "HDL", "CHOLHDL", "VLDL", "LDLCALC", "LDLDIRECT"   Wt Readings from Last 3 Encounters:  06/16/22 99.2 kg  04/26/22 98.6 kg  12/22/20 94.3 kg    Assessment and Plan:   1. CAD without angina: Minimal coronary plaque by coronary CTA January 2024. No chest pain. Continue ASA and statin.    2. Aortic stenosis: Moderate AS by echo December 2023 with mean gradient 20 mmHg. Will plan to repeat her echo in one year   3. Mitral regurgitation: Mild by echo December 2023.    Labs/ tests ordered today include:   No orders of the defined types were placed in this  encounter.  Disposition:   F/U with me in 12 months.   Signed, Lauree Chandler, MD, [facility] 06/16/2022 3:21 PM    [facility] [address], [place], [place]  [address] Phone: [phone]; Fax: [phone]

## 2022-06-16 NOTE — Patient Instructions (Signed)
Medication Instructions:  No changes *If you need a refill on your cardiac medications before your next appointment, please call your pharmacy*   Lab Work: none   Testing/Procedures: none   Follow-Up: At [facility], you and your health needs are our priority.  As part of our continuing mission to provide you with exceptional heart care, we have created designated Provider Care Teams.  These Care Teams include your primary Cardiologist (physician) and Advanced Practice Providers (APPs -  Physician Assistants and Nurse Practitioners) who all work together to provide you with the care you need, when you need it.   Your next appointment:   12 month(s)  Provider:   Christopher McAlhany, MD      

## 2022-06-19 DIAGNOSIS — E785 Hyperlipidemia, unspecified: Secondary | ICD-10-CM | POA: Diagnosis not present

## 2022-06-19 DIAGNOSIS — R7309 Other abnormal glucose: Secondary | ICD-10-CM | POA: Diagnosis not present

## 2022-06-19 DIAGNOSIS — E890 Postprocedural hypothyroidism: Secondary | ICD-10-CM | POA: Diagnosis not present

## 2022-06-19 DIAGNOSIS — Z9884 Bariatric surgery status: Secondary | ICD-10-CM | POA: Diagnosis not present

## 2022-06-19 DIAGNOSIS — Z85850 Personal history of malignant neoplasm of thyroid: Secondary | ICD-10-CM | POA: Diagnosis not present

## 2022-08-09 ENCOUNTER — Ambulatory Visit (INDEPENDENT_AMBULATORY_CARE_PROVIDER_SITE_OTHER): Payer: Medicare Other | Admitting: Podiatry

## 2022-08-09 ENCOUNTER — Ambulatory Visit (INDEPENDENT_AMBULATORY_CARE_PROVIDER_SITE_OTHER): Payer: Medicare Other

## 2022-08-09 DIAGNOSIS — M722 Plantar fascial fibromatosis: Secondary | ICD-10-CM | POA: Diagnosis not present

## 2022-08-09 DIAGNOSIS — M7752 Other enthesopathy of left foot: Secondary | ICD-10-CM | POA: Diagnosis not present

## 2022-08-09 DIAGNOSIS — M778 Other enthesopathies, not elsewhere classified: Secondary | ICD-10-CM

## 2022-08-09 MED ORDER — TRIAMCINOLONE ACETONIDE 10 MG/ML IJ SUSP
20.0000 mg | Freq: Once | INTRAMUSCULAR | Status: AC
  Administered 2022-08-09: 20 mg

## 2022-08-09 NOTE — Progress Notes (Signed)
Subjective:   Patient ID: Carol Sanchez, female   DOB: 72 y.o.   MRN: [id number]   HPI Patient states overall the top of her feet are doing pretty well but she is getting a lot of pain in her left heel and her left ankle is 2 separate problems.  Did get good feet orthotics which helped her somewhat but does have severe flatfoot deformity left over right   ROS      Objective:  Physical Exam  Acute inflammation pain of the left plantar fascia and the sinus tarsi with pain when getting up in the morning after periods of sitting with patient having difficulty stretch     Assessment:  Acute Planter fasciitis left and also sinus tarsitis left     Plan:  H&P reviewed and at this point I did do sterile prep injected the plantar fascia left 3 mg Kenalog and the left sinus tarsi with 3 mg Kenalog 5 mg Xylocaine dispensed night splint that I want her to start using to try to help control the plantar fascia and explained stretching exercises to her

## 2022-08-16 DIAGNOSIS — Z6841 Body Mass Index (BMI) 40.0 and over, adult: Secondary | ICD-10-CM | POA: Diagnosis not present

## 2022-08-16 DIAGNOSIS — E538 Deficiency of other specified B group vitamins: Secondary | ICD-10-CM | POA: Diagnosis not present

## 2022-08-16 DIAGNOSIS — R7303 Prediabetes: Secondary | ICD-10-CM | POA: Diagnosis not present

## 2022-08-16 DIAGNOSIS — R635 Abnormal weight gain: Secondary | ICD-10-CM | POA: Diagnosis not present

## 2022-08-16 DIAGNOSIS — N951 Menopausal and female climacteric states: Secondary | ICD-10-CM | POA: Diagnosis not present

## 2022-08-16 DIAGNOSIS — E039 Hypothyroidism, unspecified: Secondary | ICD-10-CM | POA: Diagnosis not present

## 2022-08-16 DIAGNOSIS — M1711 Unilateral primary osteoarthritis, right knee: Secondary | ICD-10-CM | POA: Diagnosis not present

## 2022-08-16 DIAGNOSIS — E7800 Pure hypercholesterolemia, unspecified: Secondary | ICD-10-CM | POA: Diagnosis not present

## 2022-08-16 DIAGNOSIS — E782 Mixed hyperlipidemia: Secondary | ICD-10-CM | POA: Diagnosis not present

## 2022-08-21 DIAGNOSIS — I10 Essential (primary) hypertension: Secondary | ICD-10-CM | POA: Diagnosis not present

## 2022-08-21 DIAGNOSIS — E782 Mixed hyperlipidemia: Secondary | ICD-10-CM | POA: Diagnosis not present

## 2022-08-21 DIAGNOSIS — E039 Hypothyroidism, unspecified: Secondary | ICD-10-CM | POA: Diagnosis not present

## 2022-08-21 DIAGNOSIS — Z6841 Body Mass Index (BMI) 40.0 and over, adult: Secondary | ICD-10-CM | POA: Diagnosis not present

## 2022-08-21 DIAGNOSIS — Z1331 Encounter for screening for depression: Secondary | ICD-10-CM | POA: Diagnosis not present

## 2022-08-21 DIAGNOSIS — R635 Abnormal weight gain: Secondary | ICD-10-CM | POA: Diagnosis not present

## 2022-08-21 DIAGNOSIS — Z1339 Encounter for screening examination for other mental health and behavioral disorders: Secondary | ICD-10-CM | POA: Diagnosis not present

## 2022-08-23 DIAGNOSIS — M1711 Unilateral primary osteoarthritis, right knee: Secondary | ICD-10-CM | POA: Diagnosis not present

## 2022-08-28 DIAGNOSIS — I10 Essential (primary) hypertension: Secondary | ICD-10-CM | POA: Diagnosis not present

## 2022-08-28 DIAGNOSIS — Z6841 Body Mass Index (BMI) 40.0 and over, adult: Secondary | ICD-10-CM | POA: Diagnosis not present

## 2022-08-28 DIAGNOSIS — I350 Nonrheumatic aortic (valve) stenosis: Secondary | ICD-10-CM | POA: Insufficient documentation

## 2022-08-30 DIAGNOSIS — M1711 Unilateral primary osteoarthritis, right knee: Secondary | ICD-10-CM | POA: Diagnosis not present

## 2022-09-04 DIAGNOSIS — E782 Mixed hyperlipidemia: Secondary | ICD-10-CM | POA: Diagnosis not present

## 2022-09-04 DIAGNOSIS — Z6841 Body Mass Index (BMI) 40.0 and over, adult: Secondary | ICD-10-CM | POA: Diagnosis not present

## 2022-09-11 DIAGNOSIS — E039 Hypothyroidism, unspecified: Secondary | ICD-10-CM | POA: Diagnosis not present

## 2022-09-11 DIAGNOSIS — Z6841 Body Mass Index (BMI) 40.0 and over, adult: Secondary | ICD-10-CM | POA: Diagnosis not present

## 2022-09-13 DIAGNOSIS — Z23 Encounter for immunization: Secondary | ICD-10-CM | POA: Diagnosis not present

## 2022-09-18 DIAGNOSIS — Z6841 Body Mass Index (BMI) 40.0 and over, adult: Secondary | ICD-10-CM | POA: Diagnosis not present

## 2022-09-18 DIAGNOSIS — E7800 Pure hypercholesterolemia, unspecified: Secondary | ICD-10-CM | POA: Diagnosis not present

## 2022-09-25 DIAGNOSIS — I10 Essential (primary) hypertension: Secondary | ICD-10-CM | POA: Diagnosis not present

## 2022-09-25 DIAGNOSIS — Z6841 Body Mass Index (BMI) 40.0 and over, adult: Secondary | ICD-10-CM | POA: Diagnosis not present

## 2022-10-09 DIAGNOSIS — Z6841 Body Mass Index (BMI) 40.0 and over, adult: Secondary | ICD-10-CM | POA: Diagnosis not present

## 2022-10-09 DIAGNOSIS — I10 Essential (primary) hypertension: Secondary | ICD-10-CM | POA: Diagnosis not present

## 2022-10-09 DIAGNOSIS — E039 Hypothyroidism, unspecified: Secondary | ICD-10-CM | POA: Diagnosis not present

## 2022-10-16 DIAGNOSIS — Z6839 Body mass index (BMI) 39.0-39.9, adult: Secondary | ICD-10-CM | POA: Diagnosis not present

## 2022-10-16 DIAGNOSIS — I10 Essential (primary) hypertension: Secondary | ICD-10-CM | POA: Diagnosis not present

## 2022-10-24 DIAGNOSIS — E7800 Pure hypercholesterolemia, unspecified: Secondary | ICD-10-CM | POA: Diagnosis not present

## 2022-10-24 DIAGNOSIS — Z6841 Body Mass Index (BMI) 40.0 and over, adult: Secondary | ICD-10-CM | POA: Diagnosis not present

## 2022-10-30 DIAGNOSIS — Z6841 Body Mass Index (BMI) 40.0 and over, adult: Secondary | ICD-10-CM | POA: Diagnosis not present

## 2022-10-30 DIAGNOSIS — E039 Hypothyroidism, unspecified: Secondary | ICD-10-CM | POA: Diagnosis not present

## 2022-11-06 DIAGNOSIS — I10 Essential (primary) hypertension: Secondary | ICD-10-CM | POA: Diagnosis not present

## 2022-11-06 DIAGNOSIS — Z6841 Body Mass Index (BMI) 40.0 and over, adult: Secondary | ICD-10-CM | POA: Diagnosis not present

## 2022-11-06 DIAGNOSIS — E782 Mixed hyperlipidemia: Secondary | ICD-10-CM | POA: Diagnosis not present

## 2022-11-06 DIAGNOSIS — E039 Hypothyroidism, unspecified: Secondary | ICD-10-CM | POA: Diagnosis not present

## 2022-11-13 DIAGNOSIS — Z6839 Body mass index (BMI) 39.0-39.9, adult: Secondary | ICD-10-CM | POA: Diagnosis not present

## 2022-11-13 DIAGNOSIS — R7303 Prediabetes: Secondary | ICD-10-CM | POA: Diagnosis not present

## 2022-11-14 DIAGNOSIS — M9904 Segmental and somatic dysfunction of sacral region: Secondary | ICD-10-CM | POA: Diagnosis not present

## 2022-11-14 DIAGNOSIS — M9901 Segmental and somatic dysfunction of cervical region: Secondary | ICD-10-CM | POA: Diagnosis not present

## 2022-11-14 DIAGNOSIS — M5135 Other intervertebral disc degeneration, thoracolumbar region: Secondary | ICD-10-CM | POA: Diagnosis not present

## 2022-11-14 DIAGNOSIS — M9902 Segmental and somatic dysfunction of thoracic region: Secondary | ICD-10-CM | POA: Diagnosis not present

## 2022-11-14 DIAGNOSIS — M5137 Other intervertebral disc degeneration, lumbosacral region: Secondary | ICD-10-CM | POA: Diagnosis not present

## 2022-11-14 DIAGNOSIS — M5136 Other intervertebral disc degeneration, lumbar region: Secondary | ICD-10-CM | POA: Diagnosis not present

## 2022-11-17 DIAGNOSIS — M9902 Segmental and somatic dysfunction of thoracic region: Secondary | ICD-10-CM | POA: Diagnosis not present

## 2022-11-17 DIAGNOSIS — M9904 Segmental and somatic dysfunction of sacral region: Secondary | ICD-10-CM | POA: Diagnosis not present

## 2022-11-17 DIAGNOSIS — M5136 Other intervertebral disc degeneration, lumbar region: Secondary | ICD-10-CM | POA: Diagnosis not present

## 2022-11-17 DIAGNOSIS — M5137 Other intervertebral disc degeneration, lumbosacral region: Secondary | ICD-10-CM | POA: Diagnosis not present

## 2022-11-17 DIAGNOSIS — M5135 Other intervertebral disc degeneration, thoracolumbar region: Secondary | ICD-10-CM | POA: Diagnosis not present

## 2022-11-17 DIAGNOSIS — M9901 Segmental and somatic dysfunction of cervical region: Secondary | ICD-10-CM | POA: Diagnosis not present

## 2022-11-27 DIAGNOSIS — M9904 Segmental and somatic dysfunction of sacral region: Secondary | ICD-10-CM | POA: Diagnosis not present

## 2022-11-27 DIAGNOSIS — M5136 Other intervertebral disc degeneration, lumbar region: Secondary | ICD-10-CM | POA: Diagnosis not present

## 2022-11-27 DIAGNOSIS — M5137 Other intervertebral disc degeneration, lumbosacral region: Secondary | ICD-10-CM | POA: Diagnosis not present

## 2022-11-27 DIAGNOSIS — R7303 Prediabetes: Secondary | ICD-10-CM | POA: Diagnosis not present

## 2022-11-27 DIAGNOSIS — M9901 Segmental and somatic dysfunction of cervical region: Secondary | ICD-10-CM | POA: Diagnosis not present

## 2022-11-27 DIAGNOSIS — E039 Hypothyroidism, unspecified: Secondary | ICD-10-CM | POA: Diagnosis not present

## 2022-11-27 DIAGNOSIS — Z6841 Body Mass Index (BMI) 40.0 and over, adult: Secondary | ICD-10-CM | POA: Diagnosis not present

## 2022-11-27 DIAGNOSIS — M9902 Segmental and somatic dysfunction of thoracic region: Secondary | ICD-10-CM | POA: Diagnosis not present

## 2022-11-27 DIAGNOSIS — M5135 Other intervertebral disc degeneration, thoracolumbar region: Secondary | ICD-10-CM | POA: Diagnosis not present

## 2022-11-29 DIAGNOSIS — M9902 Segmental and somatic dysfunction of thoracic region: Secondary | ICD-10-CM | POA: Diagnosis not present

## 2022-11-29 DIAGNOSIS — M5135 Other intervertebral disc degeneration, thoracolumbar region: Secondary | ICD-10-CM | POA: Diagnosis not present

## 2022-11-29 DIAGNOSIS — M9901 Segmental and somatic dysfunction of cervical region: Secondary | ICD-10-CM | POA: Diagnosis not present

## 2022-11-29 DIAGNOSIS — M5137 Other intervertebral disc degeneration, lumbosacral region: Secondary | ICD-10-CM | POA: Diagnosis not present

## 2022-11-29 DIAGNOSIS — M5136 Other intervertebral disc degeneration, lumbar region: Secondary | ICD-10-CM | POA: Diagnosis not present

## 2022-11-29 DIAGNOSIS — M9904 Segmental and somatic dysfunction of sacral region: Secondary | ICD-10-CM | POA: Diagnosis not present

## 2022-12-04 DIAGNOSIS — Z6839 Body mass index (BMI) 39.0-39.9, adult: Secondary | ICD-10-CM | POA: Diagnosis not present

## 2022-12-04 DIAGNOSIS — R7303 Prediabetes: Secondary | ICD-10-CM | POA: Diagnosis not present

## 2022-12-05 DIAGNOSIS — M5136 Other intervertebral disc degeneration, lumbar region: Secondary | ICD-10-CM | POA: Diagnosis not present

## 2022-12-05 DIAGNOSIS — M9902 Segmental and somatic dysfunction of thoracic region: Secondary | ICD-10-CM | POA: Diagnosis not present

## 2022-12-05 DIAGNOSIS — M9904 Segmental and somatic dysfunction of sacral region: Secondary | ICD-10-CM | POA: Diagnosis not present

## 2022-12-05 DIAGNOSIS — M5137 Other intervertebral disc degeneration, lumbosacral region: Secondary | ICD-10-CM | POA: Diagnosis not present

## 2022-12-05 DIAGNOSIS — M9901 Segmental and somatic dysfunction of cervical region: Secondary | ICD-10-CM | POA: Diagnosis not present

## 2022-12-05 DIAGNOSIS — M5135 Other intervertebral disc degeneration, thoracolumbar region: Secondary | ICD-10-CM | POA: Diagnosis not present

## 2022-12-07 DIAGNOSIS — M7061 Trochanteric bursitis, right hip: Secondary | ICD-10-CM | POA: Diagnosis not present

## 2022-12-08 DIAGNOSIS — M5135 Other intervertebral disc degeneration, thoracolumbar region: Secondary | ICD-10-CM | POA: Diagnosis not present

## 2022-12-08 DIAGNOSIS — M9901 Segmental and somatic dysfunction of cervical region: Secondary | ICD-10-CM | POA: Diagnosis not present

## 2022-12-08 DIAGNOSIS — M5137 Other intervertebral disc degeneration, lumbosacral region: Secondary | ICD-10-CM | POA: Diagnosis not present

## 2022-12-08 DIAGNOSIS — M9904 Segmental and somatic dysfunction of sacral region: Secondary | ICD-10-CM | POA: Diagnosis not present

## 2022-12-08 DIAGNOSIS — M9902 Segmental and somatic dysfunction of thoracic region: Secondary | ICD-10-CM | POA: Diagnosis not present

## 2022-12-08 DIAGNOSIS — M5136 Other intervertebral disc degeneration, lumbar region: Secondary | ICD-10-CM | POA: Diagnosis not present

## 2022-12-11 DIAGNOSIS — M9904 Segmental and somatic dysfunction of sacral region: Secondary | ICD-10-CM | POA: Diagnosis not present

## 2022-12-11 DIAGNOSIS — M5137 Other intervertebral disc degeneration, lumbosacral region: Secondary | ICD-10-CM | POA: Diagnosis not present

## 2022-12-11 DIAGNOSIS — M5136 Other intervertebral disc degeneration, lumbar region: Secondary | ICD-10-CM | POA: Diagnosis not present

## 2022-12-11 DIAGNOSIS — Z6839 Body mass index (BMI) 39.0-39.9, adult: Secondary | ICD-10-CM | POA: Diagnosis not present

## 2022-12-11 DIAGNOSIS — M5135 Other intervertebral disc degeneration, thoracolumbar region: Secondary | ICD-10-CM | POA: Diagnosis not present

## 2022-12-11 DIAGNOSIS — M9902 Segmental and somatic dysfunction of thoracic region: Secondary | ICD-10-CM | POA: Diagnosis not present

## 2022-12-11 DIAGNOSIS — I10 Essential (primary) hypertension: Secondary | ICD-10-CM | POA: Diagnosis not present

## 2022-12-11 DIAGNOSIS — M9901 Segmental and somatic dysfunction of cervical region: Secondary | ICD-10-CM | POA: Diagnosis not present

## 2022-12-11 DIAGNOSIS — E7800 Pure hypercholesterolemia, unspecified: Secondary | ICD-10-CM | POA: Diagnosis not present

## 2022-12-14 DIAGNOSIS — M5136 Other intervertebral disc degeneration, lumbar region: Secondary | ICD-10-CM | POA: Diagnosis not present

## 2022-12-14 DIAGNOSIS — M5137 Other intervertebral disc degeneration, lumbosacral region: Secondary | ICD-10-CM | POA: Diagnosis not present

## 2022-12-14 DIAGNOSIS — M5135 Other intervertebral disc degeneration, thoracolumbar region: Secondary | ICD-10-CM | POA: Diagnosis not present

## 2022-12-14 DIAGNOSIS — M9902 Segmental and somatic dysfunction of thoracic region: Secondary | ICD-10-CM | POA: Diagnosis not present

## 2022-12-14 DIAGNOSIS — M9904 Segmental and somatic dysfunction of sacral region: Secondary | ICD-10-CM | POA: Diagnosis not present

## 2022-12-14 DIAGNOSIS — M9901 Segmental and somatic dysfunction of cervical region: Secondary | ICD-10-CM | POA: Diagnosis not present

## 2022-12-18 DIAGNOSIS — Z6838 Body mass index (BMI) 38.0-38.9, adult: Secondary | ICD-10-CM | POA: Diagnosis not present

## 2022-12-18 DIAGNOSIS — R7303 Prediabetes: Secondary | ICD-10-CM | POA: Diagnosis not present

## 2022-12-21 DIAGNOSIS — M25551 Pain in right hip: Secondary | ICD-10-CM | POA: Diagnosis not present

## 2022-12-25 DIAGNOSIS — E039 Hypothyroidism, unspecified: Secondary | ICD-10-CM | POA: Diagnosis not present

## 2022-12-25 DIAGNOSIS — Z6838 Body mass index (BMI) 38.0-38.9, adult: Secondary | ICD-10-CM | POA: Diagnosis not present

## 2022-12-28 DIAGNOSIS — M545 Low back pain, unspecified: Secondary | ICD-10-CM | POA: Diagnosis not present

## 2023-01-01 DIAGNOSIS — Z6838 Body mass index (BMI) 38.0-38.9, adult: Secondary | ICD-10-CM | POA: Diagnosis not present

## 2023-01-01 DIAGNOSIS — I10 Essential (primary) hypertension: Secondary | ICD-10-CM | POA: Diagnosis not present

## 2023-01-08 DIAGNOSIS — I10 Essential (primary) hypertension: Secondary | ICD-10-CM | POA: Diagnosis not present

## 2023-01-08 DIAGNOSIS — E7800 Pure hypercholesterolemia, unspecified: Secondary | ICD-10-CM | POA: Diagnosis not present

## 2023-01-08 DIAGNOSIS — H53001 Unspecified amblyopia, right eye: Secondary | ICD-10-CM | POA: Diagnosis not present

## 2023-01-08 DIAGNOSIS — Z6838 Body mass index (BMI) 38.0-38.9, adult: Secondary | ICD-10-CM | POA: Diagnosis not present

## 2023-01-08 DIAGNOSIS — H5202 Hypermetropia, left eye: Secondary | ICD-10-CM | POA: Diagnosis not present

## 2023-01-08 DIAGNOSIS — H2513 Age-related nuclear cataract, bilateral: Secondary | ICD-10-CM | POA: Diagnosis not present

## 2023-01-11 DIAGNOSIS — M545 Low back pain, unspecified: Secondary | ICD-10-CM | POA: Diagnosis not present

## 2023-01-15 DIAGNOSIS — I10 Essential (primary) hypertension: Secondary | ICD-10-CM | POA: Diagnosis not present

## 2023-01-15 DIAGNOSIS — Z6839 Body mass index (BMI) 39.0-39.9, adult: Secondary | ICD-10-CM | POA: Diagnosis not present

## 2023-01-17 DIAGNOSIS — M545 Low back pain, unspecified: Secondary | ICD-10-CM | POA: Diagnosis not present

## 2023-01-19 ENCOUNTER — Other Ambulatory Visit: Payer: Self-pay | Admitting: Obstetrics and Gynecology

## 2023-01-19 DIAGNOSIS — Z1231 Encounter for screening mammogram for malignant neoplasm of breast: Secondary | ICD-10-CM

## 2023-01-22 ENCOUNTER — Ambulatory Visit: Payer: Medicare Other

## 2023-01-22 DIAGNOSIS — Z6838 Body mass index (BMI) 38.0-38.9, adult: Secondary | ICD-10-CM | POA: Diagnosis not present

## 2023-01-22 DIAGNOSIS — E7800 Pure hypercholesterolemia, unspecified: Secondary | ICD-10-CM | POA: Diagnosis not present

## 2023-01-25 DIAGNOSIS — M545 Low back pain, unspecified: Secondary | ICD-10-CM | POA: Diagnosis not present

## 2023-01-30 DIAGNOSIS — I10 Essential (primary) hypertension: Secondary | ICD-10-CM | POA: Diagnosis not present

## 2023-01-30 DIAGNOSIS — Z6838 Body mass index (BMI) 38.0-38.9, adult: Secondary | ICD-10-CM | POA: Diagnosis not present

## 2023-01-31 DIAGNOSIS — M545 Low back pain, unspecified: Secondary | ICD-10-CM | POA: Diagnosis not present

## 2023-02-02 DIAGNOSIS — M545 Low back pain, unspecified: Secondary | ICD-10-CM | POA: Diagnosis not present

## 2023-02-05 DIAGNOSIS — M545 Low back pain, unspecified: Secondary | ICD-10-CM | POA: Diagnosis not present

## 2023-02-05 DIAGNOSIS — E039 Hypothyroidism, unspecified: Secondary | ICD-10-CM | POA: Diagnosis not present

## 2023-02-05 DIAGNOSIS — Z6838 Body mass index (BMI) 38.0-38.9, adult: Secondary | ICD-10-CM | POA: Diagnosis not present

## 2023-02-09 DIAGNOSIS — M545 Low back pain, unspecified: Secondary | ICD-10-CM | POA: Diagnosis not present

## 2023-02-09 DIAGNOSIS — M25551 Pain in right hip: Secondary | ICD-10-CM | POA: Diagnosis not present

## 2023-02-12 DIAGNOSIS — Z6838 Body mass index (BMI) 38.0-38.9, adult: Secondary | ICD-10-CM | POA: Diagnosis not present

## 2023-02-12 DIAGNOSIS — I10 Essential (primary) hypertension: Secondary | ICD-10-CM | POA: Diagnosis not present

## 2023-02-13 DIAGNOSIS — M545 Low back pain, unspecified: Secondary | ICD-10-CM | POA: Diagnosis not present

## 2023-02-13 DIAGNOSIS — M25551 Pain in right hip: Secondary | ICD-10-CM | POA: Diagnosis not present

## 2023-02-15 ENCOUNTER — Ambulatory Visit: Payer: Medicare Other

## 2023-02-16 ENCOUNTER — Ambulatory Visit
Admission: RE | Admit: 2023-02-16 | Discharge: 2023-02-16 | Disposition: A | Discharge status: Home / Self Care | Payer: Medicare Other | Source: Ambulatory Visit | Attending: Obstetrics and Gynecology | Admitting: Obstetrics and Gynecology

## 2023-02-16 DIAGNOSIS — Z1231 Encounter for screening mammogram for malignant neoplasm of breast: Secondary | ICD-10-CM | POA: Diagnosis not present

## 2023-02-17 DIAGNOSIS — Z23 Encounter for immunization: Secondary | ICD-10-CM | POA: Diagnosis not present

## 2023-02-19 DIAGNOSIS — Z6838 Body mass index (BMI) 38.0-38.9, adult: Secondary | ICD-10-CM | POA: Diagnosis not present

## 2023-02-19 DIAGNOSIS — E7800 Pure hypercholesterolemia, unspecified: Secondary | ICD-10-CM | POA: Diagnosis not present

## 2023-02-20 DIAGNOSIS — M25551 Pain in right hip: Secondary | ICD-10-CM | POA: Diagnosis not present

## 2023-02-20 DIAGNOSIS — M545 Low back pain, unspecified: Secondary | ICD-10-CM | POA: Diagnosis not present

## 2023-02-26 DIAGNOSIS — R7303 Prediabetes: Secondary | ICD-10-CM | POA: Diagnosis not present

## 2023-02-26 DIAGNOSIS — E039 Hypothyroidism, unspecified: Secondary | ICD-10-CM | POA: Diagnosis not present

## 2023-02-26 DIAGNOSIS — E7800 Pure hypercholesterolemia, unspecified: Secondary | ICD-10-CM | POA: Diagnosis not present

## 2023-02-26 DIAGNOSIS — Z6838 Body mass index (BMI) 38.0-38.9, adult: Secondary | ICD-10-CM | POA: Diagnosis not present

## 2023-03-02 DIAGNOSIS — M25551 Pain in right hip: Secondary | ICD-10-CM | POA: Diagnosis not present

## 2023-03-02 DIAGNOSIS — M545 Low back pain, unspecified: Secondary | ICD-10-CM | POA: Diagnosis not present

## 2023-03-12 DIAGNOSIS — R7303 Prediabetes: Secondary | ICD-10-CM | POA: Diagnosis not present

## 2023-03-12 DIAGNOSIS — Z6838 Body mass index (BMI) 38.0-38.9, adult: Secondary | ICD-10-CM | POA: Diagnosis not present

## 2023-03-15 DIAGNOSIS — M1711 Unilateral primary osteoarthritis, right knee: Secondary | ICD-10-CM | POA: Diagnosis not present

## 2023-03-19 DIAGNOSIS — R7303 Prediabetes: Secondary | ICD-10-CM | POA: Diagnosis not present

## 2023-03-19 DIAGNOSIS — E7800 Pure hypercholesterolemia, unspecified: Secondary | ICD-10-CM | POA: Diagnosis not present

## 2023-03-19 DIAGNOSIS — E039 Hypothyroidism, unspecified: Secondary | ICD-10-CM | POA: Diagnosis not present

## 2023-03-19 DIAGNOSIS — Z6837 Body mass index (BMI) 37.0-37.9, adult: Secondary | ICD-10-CM | POA: Diagnosis not present

## 2023-03-19 DIAGNOSIS — I10 Essential (primary) hypertension: Secondary | ICD-10-CM | POA: Diagnosis not present

## 2023-03-19 DIAGNOSIS — R635 Abnormal weight gain: Secondary | ICD-10-CM | POA: Diagnosis not present

## 2023-03-26 DIAGNOSIS — Z6838 Body mass index (BMI) 38.0-38.9, adult: Secondary | ICD-10-CM | POA: Diagnosis not present

## 2023-03-26 DIAGNOSIS — E782 Mixed hyperlipidemia: Secondary | ICD-10-CM | POA: Diagnosis not present

## 2023-03-26 DIAGNOSIS — E7800 Pure hypercholesterolemia, unspecified: Secondary | ICD-10-CM | POA: Diagnosis not present

## 2023-04-01 DIAGNOSIS — M25561 Pain in right knee: Secondary | ICD-10-CM | POA: Diagnosis not present

## 2023-04-02 DIAGNOSIS — Z6837 Body mass index (BMI) 37.0-37.9, adult: Secondary | ICD-10-CM | POA: Diagnosis not present

## 2023-04-02 DIAGNOSIS — E039 Hypothyroidism, unspecified: Secondary | ICD-10-CM | POA: Diagnosis not present

## 2023-04-10 DIAGNOSIS — M7631 Iliotibial band syndrome, right leg: Secondary | ICD-10-CM | POA: Diagnosis not present

## 2023-04-14 ENCOUNTER — Other Ambulatory Visit: Payer: Self-pay | Admitting: Cardiovascular Disease

## 2023-04-16 ENCOUNTER — Encounter: Payer: Self-pay | Admitting: Cardiovascular Disease

## 2023-04-16 DIAGNOSIS — E039 Hypothyroidism, unspecified: Secondary | ICD-10-CM | POA: Diagnosis not present

## 2023-04-16 DIAGNOSIS — I350 Nonrheumatic aortic (valve) stenosis: Secondary | ICD-10-CM

## 2023-04-16 DIAGNOSIS — M79672 Pain in left foot: Secondary | ICD-10-CM | POA: Diagnosis not present

## 2023-04-16 DIAGNOSIS — Z6837 Body mass index (BMI) 37.0-37.9, adult: Secondary | ICD-10-CM | POA: Diagnosis not present

## 2023-04-16 DIAGNOSIS — M19071 Primary osteoarthritis, right ankle and foot: Secondary | ICD-10-CM | POA: Diagnosis not present

## 2023-04-19 DIAGNOSIS — M1711 Unilateral primary osteoarthritis, right knee: Secondary | ICD-10-CM | POA: Diagnosis not present

## 2023-04-19 DIAGNOSIS — M7061 Trochanteric bursitis, right hip: Secondary | ICD-10-CM | POA: Diagnosis not present

## 2023-04-23 DIAGNOSIS — Z6837 Body mass index (BMI) 37.0-37.9, adult: Secondary | ICD-10-CM | POA: Diagnosis not present

## 2023-04-23 DIAGNOSIS — I10 Essential (primary) hypertension: Secondary | ICD-10-CM | POA: Diagnosis not present

## 2023-05-01 DIAGNOSIS — E119 Type 2 diabetes mellitus without complications: Secondary | ICD-10-CM | POA: Diagnosis not present

## 2023-05-01 DIAGNOSIS — I10 Essential (primary) hypertension: Secondary | ICD-10-CM | POA: Diagnosis not present

## 2023-05-01 DIAGNOSIS — E039 Hypothyroidism, unspecified: Secondary | ICD-10-CM | POA: Diagnosis not present

## 2023-05-01 DIAGNOSIS — E7800 Pure hypercholesterolemia, unspecified: Secondary | ICD-10-CM | POA: Diagnosis not present

## 2023-05-01 LAB — LAB REPORT - SCANNED
A1c: 5.9
EGFR: 65

## 2023-05-07 DIAGNOSIS — Z6837 Body mass index (BMI) 37.0-37.9, adult: Secondary | ICD-10-CM | POA: Diagnosis not present

## 2023-05-07 DIAGNOSIS — E039 Hypothyroidism, unspecified: Secondary | ICD-10-CM | POA: Diagnosis not present

## 2023-05-07 DIAGNOSIS — I10 Essential (primary) hypertension: Secondary | ICD-10-CM | POA: Diagnosis not present

## 2023-05-09 DIAGNOSIS — Z0000 Encounter for general adult medical examination without abnormal findings: Secondary | ICD-10-CM | POA: Diagnosis not present

## 2023-05-09 DIAGNOSIS — E559 Vitamin D deficiency, unspecified: Secondary | ICD-10-CM | POA: Diagnosis not present

## 2023-05-09 DIAGNOSIS — E039 Hypothyroidism, unspecified: Secondary | ICD-10-CM | POA: Diagnosis not present

## 2023-05-09 DIAGNOSIS — E785 Hyperlipidemia, unspecified: Secondary | ICD-10-CM | POA: Diagnosis not present

## 2023-05-09 DIAGNOSIS — E8881 Metabolic syndrome: Secondary | ICD-10-CM | POA: Diagnosis not present

## 2023-05-09 DIAGNOSIS — D509 Iron deficiency anemia, unspecified: Secondary | ICD-10-CM | POA: Diagnosis not present

## 2023-05-21 DIAGNOSIS — M25551 Pain in right hip: Secondary | ICD-10-CM | POA: Diagnosis not present

## 2023-05-21 DIAGNOSIS — R319 Hematuria, unspecified: Secondary | ICD-10-CM | POA: Diagnosis not present

## 2023-05-22 ENCOUNTER — Ambulatory Visit (HOSPITAL_COMMUNITY): Payer: Medicare Other | Attending: Cardiology

## 2023-05-22 DIAGNOSIS — I350 Nonrheumatic aortic (valve) stenosis: Secondary | ICD-10-CM | POA: Insufficient documentation

## 2023-05-22 LAB — ECHOCARDIOGRAM COMPLETE
AR max vel: 1.01 cm2
AV Area VTI: 1.02 cm2
AV Area mean vel: 1.05 cm2
AV Mean grad: 24 mm[Hg]
AV Peak grad: 42.9 mm[Hg]
Ao pk vel: 3.28 m/s
Area-P 1/2: 3.24 cm2
P 1/2 time: 540 ms
S' Lateral: 2.7 cm

## 2023-05-28 DIAGNOSIS — R7303 Prediabetes: Secondary | ICD-10-CM | POA: Diagnosis not present

## 2023-05-28 DIAGNOSIS — Z6837 Body mass index (BMI) 37.0-37.9, adult: Secondary | ICD-10-CM | POA: Diagnosis not present

## 2023-06-11 DIAGNOSIS — Z6836 Body mass index (BMI) 36.0-36.9, adult: Secondary | ICD-10-CM | POA: Diagnosis not present

## 2023-06-11 DIAGNOSIS — I10 Essential (primary) hypertension: Secondary | ICD-10-CM | POA: Diagnosis not present

## 2023-06-14 ENCOUNTER — Encounter (HOSPITAL_COMMUNITY): Payer: Self-pay

## 2023-06-14 ENCOUNTER — Encounter (HOSPITAL_COMMUNITY)
Admission: RE | Admit: 2023-06-14 | Discharge: 2023-06-14 | Disposition: A | Discharge status: Home / Self Care | Payer: PPO | Source: Ambulatory Visit | Attending: Orthopedic Surgery | Admitting: Orthopedic Surgery

## 2023-06-14 ENCOUNTER — Other Ambulatory Visit: Payer: Self-pay

## 2023-06-14 VITALS — BP 143/81 | HR 75 | Temp 97.7°F | Resp 16 | Ht 62.0 in | Wt 192.0 lb

## 2023-06-14 DIAGNOSIS — Z01818 Encounter for other preprocedural examination: Secondary | ICD-10-CM | POA: Insufficient documentation

## 2023-06-14 DIAGNOSIS — R9431 Abnormal electrocardiogram [ECG] [EKG]: Secondary | ICD-10-CM | POA: Insufficient documentation

## 2023-06-14 DIAGNOSIS — I10 Essential (primary) hypertension: Secondary | ICD-10-CM | POA: Diagnosis not present

## 2023-06-14 HISTORY — DX: Personal history of urinary calculi: Z87.442

## 2023-06-14 HISTORY — DX: Anemia, unspecified: D64.9

## 2023-06-14 HISTORY — DX: Gastro-esophageal reflux disease without esophagitis: K21.9

## 2023-06-14 HISTORY — DX: Other complications of anesthesia, initial encounter: T88.59XA

## 2023-06-14 LAB — CBC
HCT: 43.8 % (ref 36.0–46.0)
Hemoglobin: 14.2 g/dL (ref 12.0–15.0)
MCH: 30.2 pg (ref 26.0–34.0)
MCHC: 32.4 g/dL (ref 30.0–36.0)
MCV: 93.2 fL (ref 80.0–100.0)
Platelets: 278 10*3/uL (ref 150–400)
RBC: 4.7 MIL/uL (ref 3.87–5.11)
RDW: 13.8 % (ref 11.5–15.5)
WBC: 8.6 10*3/uL (ref 4.0–10.5)
nRBC: 0 % (ref 0.0–0.2)

## 2023-06-14 LAB — BASIC METABOLIC PANEL
Anion gap: 8 (ref 5–15)
BUN: 24 mg/dL — ABNORMAL HIGH (ref 8–23)
CO2: 29 mmol/L (ref 22–32)
Calcium: 8.9 mg/dL (ref 8.9–10.3)
Chloride: 105 mmol/L (ref 98–111)
Creatinine, Ser: 0.91 mg/dL (ref 0.44–1.00)
GFR, Estimated: >60 mL/min (ref >60)
Glucose, Bld: 97 mg/dL (ref 70–99)
Potassium: 4 mmol/L (ref 3.5–5.1)
Sodium: 142 mmol/L (ref 135–145)

## 2023-06-14 NOTE — Patient Instructions (Addendum)
SURGICAL WAITING ROOM VISITATION  Patients having surgery or a procedure may have no more than 2 support people in the waiting area - these visitors may rotate.    Children under the age of 63 must have an adult with them who is not the patient.  Due to an increase in RSV and influenza rates and associated hospitalizations, children ages 24 and under may not visit patients in [facility] hospitals.  Visitors with respiratory illnesses are discouraged from visiting and should remain at home.  If the patient needs to stay at the hospital during part of their recovery, the visitor guidelines for inpatient rooms apply. Pre-op nurse will coordinate an appropriate time for 1 support person to accompany patient in pre-op.  This support person may not rotate.    Please refer to the [facility] website for the visitor guidelines for Inpatients (after your surgery is over and you are in a regular room).    Your procedure is scheduled on: 06/18/23   Report to [facility] Main Entrance    Report to admitting at 11:45 AM   Call this number if you have problems the morning of surgery [phone]   Do not eat food :After Midnight.   After Midnight you may have the following liquids until 11:00 AM DAY OF SURGERY  Water Non-Citrus Juices (without pulp, NO RED-Apple, White grape, White cranberry) Black Coffee (NO MILK/CREAM OR CREAMERS, sugar ok)  Clear Tea (NO MILK/CREAM OR CREAMERS, sugar ok) regular and decaf                             Plain Jell-O (NO RED)                                           Fruit ices (not with fruit pulp, NO RED)                                     Popsicles (NO RED)                                                               Sports drinks like Gatorade (NO RED)     The day of surgery:  Drink ONE (1) Pre-Surgery Clear Ensure at 11:45 AM the morning of surgery. Drink in one sitting. Do not sip.  This drink was given to you during your hospital  pre-op  appointment visit. Nothing else to drink after completing the  Pre-Surgery Clear Ensure.          If you have questions, please contact your surgeon's office.   FOLLOW BOWEL PREP AND ANY ADDITIONAL PRE OP INSTRUCTIONS YOU RECEIVED FROM YOUR SURGEON'S OFFICE!!!     Oral Hygiene is also important to reduce your risk of infection.                                    Remember - BRUSH YOUR TEETH THE MORNING OF SURGERY WITH YOUR REGULAR TOOTHPASTE  DENTURES WILL BE REMOVED PRIOR TO SURGERY PLEASE DO NOT APPLY "Poly grip" OR ADHESIVES!!!   Stop all vitamins and herbal supplements 7 days before surgery.   Take these medicines the morning of surgery with A SIP OF WATER: Tylenol, Synthroid                               You may not have any metal on your body including hair pins, jewelry, and body piercing             Do not wear make-up, lotions, powders, perfumes, or deodorant  Do not wear nail polish including gel and S&S, artificial/acrylic nails, or any other type of covering on natural nails including finger and toenails. If you have artificial nails, gel coating, etc. that needs to be removed by a nail salon please have this removed prior to surgery or surgery may need to be canceled/ delayed if the surgeon/ anesthesia feels like they are unable to be safely monitored.   Do not shave  48 hours prior to surgery.    Do not bring valuables to the hospital. [facility] IS NOT             RESPONSIBLE   FOR VALUABLES.   Contacts, glasses, dentures or bridgework may not be worn into surgery.  DO NOT BRING YOUR HOME MEDICATIONS TO THE HOSPITAL. PHARMACY WILL DISPENSE MEDICATIONS LISTED ON YOUR MEDICATION LIST TO YOU DURING YOUR ADMISSION IN THE HOSPITAL!    Patients discharged on the day of surgery will not be allowed to drive home.  Someone NEEDS to stay with you for the first 24 hours after anesthesia.              Please read over the following fact sheets you were given: IF YOU HAVE  QUESTIONS ABOUT YOUR PRE-OP INSTRUCTIONS PLEASE CALL [phone] [place]ras    If you received a COVID test during your pre-op visit  it is requested that you wear a mask when out in public, stay away from anyone that may not be feeling well and notify your surgeon if you develop symptoms. If you test positive for Covid or have been in contact with anyone that has tested positive in the last 10 days please notify you surgeon.    [facility] - Preparing for Surgery Before surgery, you can play an important role.  Because skin is not sterile, your skin needs to be as free of germs as possible.  You can reduce the number of germs on your skin by washing with CHG (chlorahexidine gluconate) soap before surgery.  CHG is an antiseptic cleaner which kills germs and bonds with the skin to continue killing germs even after washing. Please DO NOT use if you have an allergy to CHG or antibacterial soaps.  If your skin becomes reddened/irritated stop using the CHG and inform your nurse when you arrive at Short Stay. Do not shave (including legs and underarms) for at least 48 hours prior to the first CHG shower.  You may shave your face/neck.  Please follow these instructions carefully:  1.  Shower with CHG Soap the night before surgery and the  morning of surgery.  2.  If you choose to wash your hair, wash your hair first as usual with your normal  shampoo.  3.  After you shampoo, rinse your hair and body thoroughly to remove the shampoo.  4.  Use CHG as you would any other liquid soap.  You can apply chg directly to the skin and wash.  Gently with a scrungie or clean washcloth.  5.  Apply the CHG Soap to your body ONLY FROM THE NECK DOWN.   Do   not use on face/ open                           Wound or open sores. Avoid contact with eyes, ears mouth and   genitals (private parts).                       Wash face,  Genitals (private parts) with your normal soap.             6.  Wash  thoroughly, paying special attention to the area where your    surgery  will be performed.  7.  Thoroughly rinse your body with warm water from the neck down.  8.  DO NOT shower/wash with your normal soap after using and rinsing off the CHG Soap.                9.  Pat yourself dry with a clean towel.            10.  Wear clean pajamas.            11.  Place clean sheets on your bed the night of your first shower and do not  sleep with pets. Day of Surgery : Do not apply any lotions/deodorants the morning of surgery.  Please wear clean clothes to the hospital/surgery center.  FAILURE TO FOLLOW THESE INSTRUCTIONS MAY RESULT IN THE CANCELLATION OF YOUR SURGERY  PATIENT SIGNATURE_________________________________  NURSE SIGNATURE__________________________________  ________________________________________________________________________   Carol Sanchez  An incentive spirometer is a tool that can help keep your lungs clear and active. This tool measures how well you are filling your lungs with each breath. Taking long deep breaths may help reverse or decrease the chance of developing breathing (pulmonary) problems (especially infection) following: A long period of time when you are unable to move or be active. BEFORE THE PROCEDURE  If the spirometer includes an indicator to show your best effort, your nurse or respiratory therapist will set it to a desired goal. If possible, sit up straight or lean slightly forward. Try not to slouch. Hold the incentive spirometer in an upright position. INSTRUCTIONS FOR USE  Sit on the edge of your bed if possible, or sit up as far as you can in bed or on a chair. Hold the incentive spirometer in an upright position. Breathe out normally. Place the mouthpiece in your mouth and seal your lips tightly around it. Breathe in slowly and as deeply as possible, raising the piston or the ball toward the top of the column. Hold your breath for 3-5 seconds or  for as long as possible. Allow the piston or ball to fall to the bottom of the column. Remove the mouthpiece from your mouth and breathe out normally. Rest for a few seconds and repeat Steps 1 through 7 at least 10 times every 1-2 hours when you are awake. Take your time and take a few normal breaths between deep breaths. The spirometer may include an indicator to show your best effort. Use the indicator as a goal to work toward during each repetition. After each set of 10 deep breaths, practice coughing to be sure  your lungs are clear. If you have an incision (the cut made at the time of surgery), support your incision when coughing by placing a pillow or rolled up towels firmly against it. Once you are able to get out of bed, walk around indoors and cough well. You may stop using the incentive spirometer when instructed by your caregiver.  RISKS AND COMPLICATIONS Take your time so you do not get dizzy or light-headed. If you are in pain, you may need to take or ask for pain medication before doing incentive spirometry. It is harder to take a deep breath if you are having pain. AFTER USE Rest and breathe slowly and easily. It can be helpful to keep track of a log of your progress. Your caregiver can provide you with a simple table to help with this. If you are using the spirometer at home, follow these instructions: SEEK MEDICAL CARE IF:  You are having difficultly using the spirometer. You have trouble using the spirometer as often as instructed. Your pain medication is not giving enough relief while using the spirometer. You develop fever of 100.5 F (38.1 C) or higher. SEEK IMMEDIATE MEDICAL CARE IF:  You cough up bloody sputum that had not been present before. You develop fever of 102 F (38.9 C) or greater. You develop worsening pain at or near the incision site. MAKE SURE YOU:  Understand these instructions. Will watch your condition. Will get help right away if you are not doing  well or get worse. Document Released: 09/25/2006 Document Revised: 08/07/2011 Document Reviewed: 11/26/2006 [facility] Patient Information 2014 [place], [place].   ________________________________________________________________________

## 2023-06-14 NOTE — Progress Notes (Addendum)
COVID Vaccine Completed: yes  Date of COVID positive in last 90 days: no  PCP - Fatima Sanger, FNP Cardiologist - Verne Carrow, MD  Chest x-ray - n/a EKG - 06/14/23 Epic/chart Stress Test - n/a ECHO - 05/22/23 Epic  Cardiac Cath - n/a Pacemaker/ICD device last checked: n/a Spinal Cord Stimulator: n/a  Bowel Prep - no  Sleep Study - n/a CPAP -   Fasting Blood Sugar - preDM, no meds or checks at home Checks Blood Sugar _____ times a day  Last dose of GLP1 agonist- Semaglutide every Monday GLP1 instructions:  Hold 7 days before surgery, last dose 06/04/23   Last dose of SGLT-2 inhibitors-  N/A SGLT-2 instructions:  Hold 3 days before surgery    Blood Thinner Instructions:  Time Aspirin Instructions: ASA 81, on hold for surgery Last Dose:  Activity level: Can go up a flight of stairs and perform activities of daily living without stopping and without symptoms of chest pain or shortness of breath.  Anesthesia review: CAD, HTN, preDM  Patient denies shortness of breath, fever, cough and chest pain at PAT appointment  Patient verbalized understanding of instructions that were given to them at the PAT appointment. Patient was also instructed that they will need to review over the PAT instructions again at home before surgery.

## 2023-06-14 NOTE — Progress Notes (Signed)
Please place orders for surgery

## 2023-06-15 ENCOUNTER — Ambulatory Visit: Payer: PPO | Attending: Cardiovascular Disease | Admitting: Cardiovascular Disease

## 2023-06-15 ENCOUNTER — Encounter: Payer: Self-pay | Admitting: Cardiovascular Disease

## 2023-06-15 VITALS — BP 146/86 | HR 77 | Ht 62.0 in | Wt 194.2 lb

## 2023-06-15 DIAGNOSIS — I2510 Atherosclerotic heart disease of native coronary artery without angina pectoris: Secondary | ICD-10-CM

## 2023-06-15 DIAGNOSIS — I340 Nonrheumatic mitral (valve) insufficiency: Secondary | ICD-10-CM

## 2023-06-15 DIAGNOSIS — I350 Nonrheumatic aortic (valve) stenosis: Secondary | ICD-10-CM | POA: Diagnosis not present

## 2023-06-15 NOTE — Progress Notes (Signed)
Chief Complaint  Patient presents with   Follow-up    CAD, aortic stenosis   History of Present Illness: 73 yo female with history of thyroid cancer, HTN and aortic calcification who is here today for follow up. I saw her as a new consult in November 2023 for the evaluation of an abnormal coronary calcium score of 17.4 by CT scan on 03/24/22. She has had a gastric bypass surgery. She has mild carotid artery disease and did not tolerate low dose Crestor. She had tolerated Pravastatin in the past. She reported a cardiac murmur for years. Her aortic valve calcium score on the CT was over 1000. She reported dyspnea with moderate exertion. No chest pain or LE edema.  Echo 05/16/22 with LVEF=60-65%, mild MR, mild to moderate AS (mean gradient 20 mmHg), mild AI. Coronary CTA 05/30/22 with minimal coronary plaque (less than 20% proximal LAD stenosis), calcium score of 6. Ascending aorta normal size. Echo December 2024 with LVEF=60-65%, grade 2 diastolic dysfunction, moderate MR, moderate AS (mean gradient 24 mmHg), mild AI.   She is here today for follow up. The patient denies any chest pain, dyspnea, palpitations, lower extremity edema, orthopnea, PND, dizziness, near syncope or syncope. She has right hip surgery next week.   She is a retired NP. I took care of her mother Melina Modena).  Primary Care Physician: Fatima Sanger, FNP Irena Reichmann, MD)  Past Medical History:  Diagnosis Date   Anemia    in the past, r/o hiatal hernia   Aortic calcification (HCC)    Cancer (HCC) 2003   thyroid   Carotid artery disease (HCC)    Complication of anesthesia    very sensitive to anesthesia   GERD (gastroesophageal reflux disease)    before gatric bypass   History of kidney stones    Hypertension    Pre-diabetes    Metobolic Syndrome    Past Surgical History:  Procedure Laterality Date   ABDOMINAL HYSTERECTOMY     BUNIONECTOMY     left foot   GASTRIC ROUX-EN-Y N/A 01/22/2018   Procedure:  LAPAROSCOPIC ROUX-EN-Y GASTRIC BYPASS WITH UPPER ENDOSCOPY;  Surgeon: Luretha Murphy, MD;  Location: [facility] ORS;  Service: General;  Laterality: N/A;   HIATAL HERNIA REPAIR N/A 01/22/2018   Procedure: LAPAROSCOPIC REPAIR OF HIATAL HERNIA;  Surgeon: Luretha Murphy, MD;  Location: [facility] ORS;  Service: General;  Laterality: N/A;   LIPOMA EXCISION     LITHOTRIPSY     THYROIDECTOMY  2003   TUBAL LIGATION      Current Outpatient Medications  Medication Sig Dispense Refill   acetaminophen (TYLENOL) 500 MG tablet Take 1,000 mg by mouth 2 (two) times daily as needed for headache.     aspirin EC 81 MG tablet Take 1 tablet (81 mg total) by mouth daily. Swallow whole. 90 tablet 3   Calcium Carbonate-Vitamin D (CALCIUM-D PO) Take 1 tablet by mouth in the morning, at noon, and at bedtime.     diclofenac sodium (VOLTAREN) 1 % GEL Apply 1 application topically daily as needed (pain).     EPINEPHrine 0.3 mg/0.3 mL IJ SOAJ injection Inject 0.3 mg into the muscle as needed for anaphylaxis.     hydrochlorothiazide (HYDRODIURIL) 25 MG tablet Take 12.5 mg by mouth daily.     Multiple Vitamin (MULTIVITAMIN WITH MINERALS) TABS tablet Take 1 tablet by mouth daily.     pravastatin (PRAVACHOL) 20 MG tablet Take 1 tablet (20 mg total) by mouth every evening. Please keep  scheduled appointment for future refills. Thank you. 90 tablet 0   PRESCRIPTION MEDICATION Inject 15 Units into the skin once a week. Compounded Semaglutide- (Mondays)  unknown dose.     SYNTHROID 112 MCG tablet Take 112 mcg by mouth every morning.     zolpidem (AMBIEN) 5 MG tablet Take 2.5-5 mg by mouth at bedtime as needed for sleep.     No current facility-administered medications for this visit.    Allergies  Allergen Reactions   Shellfish Allergy Hives   Cephalexin Itching and Other (See Comments)    Flushing   Penicillins Rash   Sulfa Antibiotics Rash   Atorvastatin Other (See Comments)    muscle aches   Statins Other (See Comments)     muscle aches    Social History   Socioeconomic History   Marital status: Divorced    Spouse name: Not on file   Number of children: 3   Years of education: Not on file   Highest education level: Not on file  Occupational History   Occupation: Retired NP  Tobacco Use   Smoking status: Former    Current packs/day: 0.00    Types: Cigarettes    Quit date: 06/23/1975    Years since quitting: 48.0   Smokeless tobacco: Never  Vaping Use   Vaping status: Never Used  Substance and Sexual Activity   Alcohol use: Yes    Comment: socially   Drug use: Never   Sexual activity: Not on file  Other Topics Concern   Not on file  Social History Narrative   Not on file   Social Drivers of Health   Financial Resource Strain: Not on file  Food Insecurity: No Food Insecurity (05/28/2017)   Hunger Vital Sign    Worried About Running Out of Food in the Last Year: Never true    Ran Out of Food in the Last Year: Never true  Transportation Needs: Not on file  Physical Activity: Not on file  Stress: Not on file  Social Connections: Not on file  Intimate Partner Violence: Not on file    Family History  Problem Relation Age of Onset   CAD Mother    Heart attack Father    Hypertension Other    Heart disease Other     Review of Systems:  As stated in the HPI and otherwise negative.   BP (!) 146/86   Pulse 77   Ht 5\' 2"  (1.575 m)   Wt 88.1 kg   SpO2 96%   BMI 35.52 kg/m   Physical Examination: General: Well developed, well nourished, NAD  HEENT: OP clear, mucus membranes moist  SKIN: warm, dry. No rashes. Neuro: No focal deficits  Musculoskeletal: Muscle strength 5/5 all ext  Psychiatric: Mood and affect normal  Neck: No JVD, no carotid bruits, no thyromegaly, no lymphadenopathy.  Lungs:Clear bilaterally, no wheezes, rhonci, crackles Cardiovascular: Regular rate and rhythm. systolic murmur.  Abdomen:Soft. Bowel sounds present. Non-tender.  Extremities: No lower extremity  edema. Pulses are 2 + in the bilateral DP/PT.  EKG:  EKG is not ordered today. The ekg ordered today demonstrates  EKG yesterday with NSR  Recent Labs: 06/14/2023: BUN 24; Creatinine, Ser 0.91; Hemoglobin 14.2; Platelets 278; Potassium 4.0; Sodium 142   Lipid Panel No results found for: "CHOL", "TRIG", "HDL", "CHOLHDL", "VLDL", "LDLCALC", "LDLDIRECT"   Wt Readings from Last 3 Encounters:  06/15/23 88.1 kg  06/14/23 87.1 kg  06/16/22 99.2 kg    Assessment and Plan:  1. CAD without angina: Minimal coronary plaque by coronary CTA January 2024. No chest pain suggestive of angina. Will continue ASA and statin.     2. Aortic stenosis: Moderate AS by echo December 2024 with mean gradient 24 mmHg. Repeat echo December 2025.   3. Mitral regurgitation: Moderate by echo December 2024.     4. Pre-operative cardiac evaluation: She is doing well. No cardiac complaints. OK to proceed with her planned surgical procedure next week.   Labs/ tests ordered today include:   Orders Placed This Encounter  Procedures   ECHOCARDIOGRAM COMPLETE   Disposition:   F/U with me in 12 months.   Signed, Verne Carrow, MD, [facility] 06/15/2023 9:38 AM    [facility] [address] [place], [place], [place]  [address] Phone: [phone]; Fax: [phone]

## 2023-06-15 NOTE — Patient Instructions (Addendum)
Medication Instructions:  No changes *If you need a refill on your cardiac medications before your next appointment, please call your pharmacy*   Lab Work: none   Testing/Procedures: Your physician has requested that you have an echocardiogram. Echocardiography is a painless test that uses sound waves to create images of your heart. It provides your doctor with information about the size and shape of your heart and how well your heart's chambers and valves are working. This procedure takes approximately one hour. There are no restrictions for this procedure. Please do NOT wear cologne, perfume, aftershave, or lotions (deodorant is allowed). Please arrive 15 minutes prior to your appointment time.  Please note: We ask at that you not bring children with you during ultrasound (echo/ vascular) testing. Due to room size and safety concerns, children are not allowed in the ultrasound rooms during exams. Our front office staff cannot provide observation of children in our lobby area while testing is being conducted. An adult accompanying a patient to their appointment will only be allowed in the ultrasound room at the discretion of the ultrasound technician under special circumstances. We apologize for any inconvenience.   Follow-Up: At [facility], you and your health needs are our priority.  As part of our continuing mission to provide you with exceptional heart care, we have created designated Provider Care Teams.  These Care Teams include your primary Cardiologist (physician) and Advanced Practice Providers (APPs -  Physician Assistants and Nurse Practitioners) who all work together to provide you with the care you need, when you need it.   Your next appointment:   12 month(s)  Provider:   Verne Carrow, MD      1st Floor: - Lobby - Registration  - Pharmacy  - Lab - Cafe  2nd Floor: - PV Lab - Diagnostic Testing (echo, CT, nuclear med)  3rd Floor: -  Vacant  4th Floor: - TCTS (cardiothoracic surgery) - AFib Clinic - Structural Heart Clinic - Vascular Surgery  - Vascular Ultrasound  5th Floor: - HeartCare Cardiology (general and EP) - Clinical Pharmacy for coumadin, hypertension, lipid, weight-loss medications, and med management appointments    Valet parking services will be available as well.

## 2023-06-15 NOTE — Anesthesia Preprocedure Evaluation (Signed)
Anesthesia Evaluation  Patient identified by MRN, date of birth, ID band Patient awake    Reviewed: Allergy & Precautions, NPO status , Patient's Chart, lab work & pertinent test results  History of Anesthesia Complications Negative for: history of anesthetic complications  Airway Mallampati: II  TM Distance: >3 FB Neck ROM: Full    Dental no notable dental hx.    Pulmonary former smoker   Pulmonary exam normal        Cardiovascular hypertension, Normal cardiovascular exam  Echo 05/22/2023:  1. Fixed right coronary cusp with moderate AS (mean gradient 24 mmHg; AVA 1.05 cm2).  2. Left ventricular ejection fraction, by estimation, is 60 to 65%. The left ventricle has normal function. The left ventricle has no regional wall motion abnormalities. There is moderate left ventricular hypertrophy of the basal-septal segment. Left ventricular diastolic parameters are consistent with Grade II diastolic dysfunction (pseudonormalization). The average left ventricular global longitudinal strain is 20.2 %. The global longitudinal strain is normal.  3. Right ventricular systolic function is normal. The right ventricular size is normal.  4. The mitral valve is normal in structure. Moderate mitral valve regurgitation. No evidence of mitral stenosis.  5. The aortic valve is normal in structure. Aortic valve regurgitation is mild. Moderate aortic valve stenosis.  6. The inferior vena cava is normal in size with greater than 50% respiratory variability, suggesting right atrial pressure of 3 mmHg.    Neuro/Psych    GI/Hepatic hiatal hernia,GERD  ,,  Endo/Other  Hypothyroidism    Renal/GU      Musculoskeletal   Abdominal   Peds  Hematology   Anesthesia Other Findings   Reproductive/Obstetrics                             Anesthesia Physical Anesthesia Plan  ASA: 3  Anesthesia Plan: General   Post-op Pain  Management: Ofirmev IV (intra-op)*   Induction: Intravenous  PONV Risk Score and Plan: 3 and Treatment may vary due to age or medical condition, Ondansetron, Dexamethasone and Midazolam  Airway Management Planned: Oral ETT  Additional Equipment: None  Intra-op Plan:   Post-operative Plan: Extubation in OR  Informed Consent: I have reviewed the patients History and Physical, chart, labs and discussed the procedure including the risks, benefits and alternatives for the proposed anesthesia with the patient or authorized representative who has indicated his/her understanding and acceptance.     Dental advisory given  Plan Discussed with: CRNA  Anesthesia Plan Comments: (See PAT note from 1/16 by K Gekas PA-C )        Anesthesia Quick Evaluation

## 2023-06-15 NOTE — Progress Notes (Signed)
Case: [id number] Date/Time: 06/18/23 1345   Procedures:      Right hip bursectomy; gluteal tendon repair (Right: Hip) -     OPEN REPAIR OF GLUTEAL TENDON (Right)   Anesthesia type: Choice   Pre-op diagnosis: Right hip bursitis; gluteal tendon tear   Location: [facility] ROOM 10 / [facility]   Surgeons: Ollen Gross, MD       DISCUSSION: Carol Sanchez is a 73 year old female who presents to PAT prior to surgery above.  Past medical history significant for former smoking, hypertension, CAD (mild by CTA), aortic atherosclerosis, aortic stenosis, mitral regurgitation, mild carotid artery disease, history of thyroid cancer status post thyroidectomy (2003), GERD, hiatal hernia status post repair (2019), s/p gastric bypass (2019).  Prior complications from anesthesia is at patient is reportedly "sensitive" to medications.  Patient follows with cardiac allergy for history of CAD, aortic calcifications, and aortic stenosis.  Last seen on 06/15/2023.  She denies any cardiac symptoms.  Prior CTA of the coronaries in 05/2022 showed mild CAD.  She has on medical therapy for this.  She has moderate aortic stenosis and mitral regurgitation by echo in December 2024.  Cleared for surgery by Dr. Clifton James:  "Pre-operative cardiac evaluation: She is doing well. No cardiac complaints. OK to proceed with her planned surgical procedure next week."  Last dose of GLP1 agonist- Semaglutide every Monday, 06/04/23  VS: BP (!) 143/81   Pulse 75   Temp 36.5 C (Oral)   Resp 16   Ht 5\' 2"  (1.575 m)   Wt 87.1 kg   SpO2 99%   BMI 35.12 kg/m   PROVIDERS: Fatima Sanger, FNP Cardiologist - Verne Carrow, MD  LABS: Labs reviewed: Acceptable for surgery. (all labs ordered are listed, but only abnormal results are displayed)  Labs Reviewed  BASIC METABOLIC PANEL - Abnormal; Notable for the following components:      Result Value   BUN 24 (*)    All other components within normal limits  CBC      IMAGES: CT chest (over read) 03/24/2022:  IMPRESSION: 1. No acute noncardiac cardiopulmonary findings. 2. Moderate-sized hiatal hernia.  EKG 06/14/2023 Normal sinus rhythm rate 73 Possible Anterolateral infarct , age undetermined  CV: Echo 05/22/2023:  IMPRESSIONS    1. Fixed right coronary cusp with moderate AS (mean gradient 24 mmHg; AVA 1.05 cm2).  2. Left ventricular ejection fraction, by estimation, is 60 to 65%. The left ventricle has normal function. The left ventricle has no regional wall motion abnormalities. There is moderate left ventricular hypertrophy of the basal-septal segment. Left ventricular diastolic parameters are consistent with Grade II diastolic dysfunction (pseudonormalization). The average left ventricular global longitudinal strain is 20.2 %. The global longitudinal strain is normal.  3. Right ventricular systolic function is normal. The right ventricular size is normal.  4. The mitral valve is normal in structure. Moderate mitral valve regurgitation. No evidence of mitral stenosis.  5. The aortic valve is normal in structure. Aortic valve regurgitation is mild. Moderate aortic valve stenosis.  6. The inferior vena cava is normal in size with greater than 50% respiratory variability, suggesting right atrial pressure of 3 mmHg.  CTA coronary 05/30/2022:  IMPRESSION: 1.  Minimal nonobstructive CAD, CADRADS = 1.   2. Coronary calcium score of 6. This was 45th percentile for age and sex matched control.   3. Normal coronary origin with right dominance.   4. Hiatal hernia   5. Mild to moderate calcification and thickening of the aortic  valve.   INTERPRETATION:   CAD-RADS 1: Minimal non-obstructive CAD (1-24%). Consider non-atherosclerotic causes of chest pain. Consider preventive therapy and risk factor modification.   CT calcium score 03/24/2022: IMPRESSION: 1. Coronary calcium score of 17.4. This was 52nd percentile for age-,  race-, and sex-matched controls.   2. Aortic valve calcium score 1044.   Past Medical History:  Diagnosis Date   Anemia    in the past, r/o hiatal hernia   Aortic calcification (HCC)    Cancer (HCC) 2003   thyroid   Carotid artery disease (HCC)    Complication of anesthesia    very sensitive to anesthesia   GERD (gastroesophageal reflux disease)    before gatric bypass   History of kidney stones    Hypertension    Pre-diabetes    Metobolic Syndrome    Past Surgical History:  Procedure Laterality Date   ABDOMINAL HYSTERECTOMY     BUNIONECTOMY     left foot   GASTRIC ROUX-EN-Y N/A 01/22/2018   Procedure: LAPAROSCOPIC ROUX-EN-Y GASTRIC BYPASS WITH UPPER ENDOSCOPY;  Surgeon: Luretha Murphy, MD;  Location: [facility];  Service: General;  Laterality: N/A;   HIATAL HERNIA REPAIR N/A 01/22/2018   Procedure: LAPAROSCOPIC REPAIR OF HIATAL HERNIA;  Surgeon: Luretha Murphy, MD;  Location: [facility];  Service: General;  Laterality: N/A;   LIPOMA EXCISION     LITHOTRIPSY     THYROIDECTOMY  2003   TUBAL LIGATION      MEDICATIONS:  acetaminophen (TYLENOL) 500 MG tablet   aspirin EC 81 MG tablet   Calcium Carbonate-Vitamin D (CALCIUM-D PO)   diclofenac sodium (VOLTAREN) 1 % GEL   EPINEPHrine 0.3 mg/0.3 mL IJ SOAJ injection   hydrochlorothiazide (HYDRODIURIL) 25 MG tablet   Multiple Vitamin (MULTIVITAMIN WITH MINERALS) TABS tablet   pravastatin (PRAVACHOL) 20 MG tablet   PRESCRIPTION MEDICATION   SYNTHROID 112 MCG tablet   zolpidem (AMBIEN) 5 MG tablet   No current facility-administered medications for this encounter.   Marcille Blanco [facility] Short Stay/Anesthesiology [facility] Phone [phone] 06/15/2023 10:53 AM

## 2023-06-18 ENCOUNTER — Encounter (HOSPITAL_COMMUNITY): Payer: Self-pay | Admitting: Orthopedic Surgery

## 2023-06-18 ENCOUNTER — Ambulatory Visit (HOSPITAL_COMMUNITY): Payer: Self-pay | Admitting: Medical

## 2023-06-18 ENCOUNTER — Encounter (HOSPITAL_COMMUNITY)
Admission: RE | Disposition: A | Discharge status: Home / Self Care | Payer: Self-pay | Source: Ambulatory Visit | Attending: Orthopedic Surgery

## 2023-06-18 ENCOUNTER — Ambulatory Visit (HOSPITAL_BASED_OUTPATIENT_CLINIC_OR_DEPARTMENT_OTHER): Payer: PPO | Admitting: Anesthesiology

## 2023-06-18 ENCOUNTER — Other Ambulatory Visit: Payer: Self-pay

## 2023-06-18 ENCOUNTER — Ambulatory Visit (HOSPITAL_COMMUNITY)
Admission: RE | Admit: 2023-06-18 | Discharge: 2023-06-18 | Disposition: A | Discharge status: Home / Self Care | Payer: PPO | Source: Ambulatory Visit | Attending: Orthopedic Surgery | Admitting: Orthopedic Surgery

## 2023-06-18 DIAGNOSIS — I10 Essential (primary) hypertension: Secondary | ICD-10-CM

## 2023-06-18 DIAGNOSIS — Z87891 Personal history of nicotine dependence: Secondary | ICD-10-CM | POA: Diagnosis not present

## 2023-06-18 DIAGNOSIS — M7071 Other bursitis of hip, right hip: Secondary | ICD-10-CM

## 2023-06-18 DIAGNOSIS — E039 Hypothyroidism, unspecified: Secondary | ICD-10-CM | POA: Insufficient documentation

## 2023-06-18 DIAGNOSIS — M7601 Gluteal tendinitis, right hip: Secondary | ICD-10-CM | POA: Diagnosis not present

## 2023-06-18 DIAGNOSIS — S76011A Strain of muscle, fascia and tendon of right hip, initial encounter: Secondary | ICD-10-CM | POA: Diagnosis not present

## 2023-06-18 DIAGNOSIS — Z79890 Hormone replacement therapy (postmenopausal): Secondary | ICD-10-CM | POA: Diagnosis not present

## 2023-06-18 DIAGNOSIS — M7061 Trochanteric bursitis, right hip: Secondary | ICD-10-CM | POA: Diagnosis present

## 2023-06-18 DIAGNOSIS — D649 Anemia, unspecified: Secondary | ICD-10-CM | POA: Diagnosis not present

## 2023-06-18 DIAGNOSIS — K219 Gastro-esophageal reflux disease without esophagitis: Secondary | ICD-10-CM | POA: Insufficient documentation

## 2023-06-18 HISTORY — PX: EXCISION/RELEASE BURSA HIP: SHX5014

## 2023-06-18 HISTORY — PX: OPEN SURGICAL REPAIR OF GLUTEAL TENDON: SHX5995

## 2023-06-18 SURGERY — RELEASE, BURSA, TROCHANTERIC
Anesthesia: General | Site: Hip | Laterality: Right

## 2023-06-18 MED ORDER — FENTANYL CITRATE PF 50 MCG/ML IJ SOSY
25.0000 ug | PREFILLED_SYRINGE | INTRAMUSCULAR | Status: DC | PRN
  Administered 2023-06-18 (×2): 50 ug via INTRAVENOUS

## 2023-06-18 MED ORDER — 0.9 % SODIUM CHLORIDE (POUR BTL) OPTIME
TOPICAL | Status: DC | PRN
  Administered 2023-06-18: 1000 mL

## 2023-06-18 MED ORDER — BUPIVACAINE-EPINEPHRINE (PF) 0.25% -1:200000 IJ SOLN
INTRAMUSCULAR | Status: DC | PRN
  Administered 2023-06-18: 30 mL

## 2023-06-18 MED ORDER — PROPOFOL 10 MG/ML IV BOLUS
INTRAVENOUS | Status: DC | PRN
  Administered 2023-06-18: 160 mg via INTRAVENOUS

## 2023-06-18 MED ORDER — CHLORHEXIDINE GLUCONATE 0.12 % MT SOLN
15.0000 mL | Freq: Once | OROMUCOSAL | Status: AC
  Administered 2023-06-18: 15 mL via OROMUCOSAL

## 2023-06-18 MED ORDER — FENTANYL CITRATE PF 50 MCG/ML IJ SOSY
PREFILLED_SYRINGE | INTRAMUSCULAR | Status: AC
  Filled 2023-06-18: qty 2

## 2023-06-18 MED ORDER — LIDOCAINE HCL (CARDIAC) PF 100 MG/5ML IV SOSY
PREFILLED_SYRINGE | INTRAVENOUS | Status: DC | PRN
  Administered 2023-06-18: 100 mg via INTRAVENOUS

## 2023-06-18 MED ORDER — ACETAMINOPHEN 10 MG/ML IV SOLN: 1000.0000 mg | Freq: Once | INTRAVENOUS | Status: DC | PRN

## 2023-06-18 MED ORDER — ACETAMINOPHEN 10 MG/ML IV SOLN
1000.0000 mg | Freq: Four times a day (QID) | INTRAVENOUS | Status: DC
  Administered 2023-06-18: 1000 mg via INTRAVENOUS
  Filled 2023-06-18: qty 100

## 2023-06-18 MED ORDER — HYDROMORPHONE HCL 1 MG/ML IJ SOLN
INTRAMUSCULAR | Status: AC
  Filled 2023-06-18: qty 1

## 2023-06-18 MED ORDER — ORAL CARE MOUTH RINSE: 15.0000 mL | Freq: Once | OROMUCOSAL | Status: AC

## 2023-06-18 MED ORDER — FENTANYL CITRATE (PF) 100 MCG/2ML IJ SOLN
INTRAMUSCULAR | Status: AC
  Filled 2023-06-18: qty 2

## 2023-06-18 MED ORDER — OXYCODONE HCL 5 MG PO TABS: 5.0000 mg | ORAL_TABLET | Freq: Once | ORAL | Status: DC | PRN

## 2023-06-18 MED ORDER — PHENYLEPHRINE 80 MCG/ML (10ML) SYRINGE FOR IV PUSH (FOR BLOOD PRESSURE SUPPORT)
PREFILLED_SYRINGE | INTRAVENOUS | Status: AC
  Filled 2023-06-18: qty 10

## 2023-06-18 MED ORDER — LACTATED RINGERS IV SOLN: INTRAVENOUS | Status: DC

## 2023-06-18 MED ORDER — LIDOCAINE HCL (PF) 2 % IJ SOLN
INTRAMUSCULAR | Status: AC
  Filled 2023-06-18: qty 5

## 2023-06-18 MED ORDER — PROPOFOL 10 MG/ML IV BOLUS
INTRAVENOUS | Status: AC
  Filled 2023-06-18: qty 20

## 2023-06-18 MED ORDER — DROPERIDOL 2.5 MG/ML IJ SOLN: 0.6250 mg | Freq: Once | INTRAMUSCULAR | Status: DC | PRN

## 2023-06-18 MED ORDER — DEXAMETHASONE SODIUM PHOSPHATE 10 MG/ML IJ SOLN
8.0000 mg | Freq: Once | INTRAMUSCULAR | Status: AC
  Administered 2023-06-18: 8 mg via INTRAVENOUS

## 2023-06-18 MED ORDER — OXYCODONE HCL 5 MG/5ML PO SOLN: 5.0000 mg | Freq: Once | ORAL | Status: DC | PRN

## 2023-06-18 MED ORDER — FENTANYL CITRATE (PF) 100 MCG/2ML IJ SOLN
INTRAMUSCULAR | Status: DC | PRN
  Administered 2023-06-18 (×3): 50 ug via INTRAVENOUS

## 2023-06-18 MED ORDER — HYDROMORPHONE HCL 1 MG/ML IJ SOLN
0.2500 mg | INTRAMUSCULAR | Status: DC | PRN
  Administered 2023-06-18 (×2): 0.5 mg via INTRAVENOUS

## 2023-06-18 MED ORDER — LACTATED RINGERS IV SOLN: INTRAVENOUS | Status: DC | PRN

## 2023-06-18 MED ORDER — ROCURONIUM BROMIDE 10 MG/ML (PF) SYRINGE
PREFILLED_SYRINGE | INTRAVENOUS | Status: AC
  Filled 2023-06-18: qty 10

## 2023-06-18 MED ORDER — DEXAMETHASONE SODIUM PHOSPHATE 10 MG/ML IJ SOLN
INTRAMUSCULAR | Status: AC
  Filled 2023-06-18: qty 1

## 2023-06-18 MED ORDER — ONDANSETRON HCL 4 MG/2ML IJ SOLN
INTRAMUSCULAR | Status: DC | PRN
  Administered 2023-06-18: 4 mg via INTRAVENOUS

## 2023-06-18 MED ORDER — PHENYLEPHRINE HCL (PRESSORS) 10 MG/ML IV SOLN
INTRAVENOUS | Status: DC | PRN
  Administered 2023-06-18: 80 ug via INTRAVENOUS
  Administered 2023-06-18: 240 ug via INTRAVENOUS
  Administered 2023-06-18: 160 ug via INTRAVENOUS
  Administered 2023-06-18: 80 ug via INTRAVENOUS
  Administered 2023-06-18 (×2): 160 ug via INTRAVENOUS
  Administered 2023-06-18: 80 ug via INTRAVENOUS

## 2023-06-18 MED ORDER — TRANEXAMIC ACID-NACL 1000-0.7 MG/100ML-% IV SOLN
1000.0000 mg | INTRAVENOUS | Status: AC
  Administered 2023-06-18: 1000 mg via INTRAVENOUS
  Filled 2023-06-18: qty 100

## 2023-06-18 MED ORDER — ROCURONIUM BROMIDE 100 MG/10ML IV SOLN
INTRAVENOUS | Status: DC | PRN
  Administered 2023-06-18: 50 mg via INTRAVENOUS

## 2023-06-18 MED ORDER — ONDANSETRON HCL 4 MG/2ML IJ SOLN
INTRAMUSCULAR | Status: AC
Start: 2023-06-18 — End: ?
  Filled 2023-06-18: qty 2

## 2023-06-18 MED ORDER — VANCOMYCIN HCL IN DEXTROSE 1-5 GM/200ML-% IV SOLN
1000.0000 mg | INTRAVENOUS | Status: AC
  Administered 2023-06-18: 1000 mg via INTRAVENOUS
  Filled 2023-06-18: qty 200

## 2023-06-18 SURGICAL SUPPLY — 45 items
ANCHOR SUT BIO SW 4.75X19.1 (Anchor) IMPLANT
BAG COUNTER SPONGE SURGICOUNT (BAG) IMPLANT
BAG ZIPLOCK 12X15 (MISCELLANEOUS) IMPLANT
BIT DRILL 2.4X128 (BIT) IMPLANT
BIT DRILL 2.8X128 (BIT) IMPLANT
BLADE EXTENDED COATED 6.5IN (ELECTRODE) IMPLANT
COVER SURGICAL LIGHT HANDLE (MISCELLANEOUS) ×2 IMPLANT
DERMABOND ADVANCED .7 DNX12 (GAUZE/BANDAGES/DRESSINGS) ×2 IMPLANT
DRAPE INCISE IOBAN 66X45 STRL (DRAPES) ×2 IMPLANT
DRAPE POUCH INSTRU U-SHP 10X18 (DRAPES) ×2 IMPLANT
DRAPE SURG ORHT 6 SPLT 77X108 (DRAPES) ×4 IMPLANT
DRAPE U-SHAPE 47X51 STRL (DRAPES) ×2 IMPLANT
DRSG AQUACEL AG ADV 3.5X10 (GAUZE/BANDAGES/DRESSINGS) ×2 IMPLANT
DURAPREP 26ML APPLICATOR (WOUND CARE) ×2 IMPLANT
ELECT REM PT RETURN 15FT ADLT (MISCELLANEOUS) ×2 IMPLANT
EVACUATOR 1/8 PVC DRAIN (DRAIN) IMPLANT
FACESHIELD WRAPAROUND (MASK) ×4 IMPLANT
FACESHIELD WRAPAROUND OR TEAM (MASK) ×4 IMPLANT
GAUZE SPONGE 4X4 12PLY STRL (GAUZE/BANDAGES/DRESSINGS) ×2 IMPLANT
GLOVE BIO SURGEON STRL SZ 6.5 (GLOVE) ×2 IMPLANT
GLOVE BIO SURGEON STRL SZ7.5 (GLOVE) IMPLANT
GLOVE BIO SURGEON STRL SZ8 (GLOVE) ×2 IMPLANT
GLOVE BIOGEL PI IND STRL 6.5 (GLOVE) IMPLANT
GLOVE BIOGEL PI IND STRL 7.0 (GLOVE) ×2 IMPLANT
GLOVE BIOGEL PI IND STRL 8 (GLOVE) ×2 IMPLANT
GOWN STRL REUS W/ TWL LRG LVL3 (GOWN DISPOSABLE) ×4 IMPLANT
KIT BASIN OR (CUSTOM PROCEDURE TRAY) ×2 IMPLANT
KIT TURNOVER KIT A (KITS) IMPLANT
MANIFOLD NEPTUNE II (INSTRUMENTS) ×2 IMPLANT
NDL MA TROC 1/2 (NEEDLE) IMPLANT
NDL SAFETY ECLIPSE 18X1.5 (NEEDLE) ×4 IMPLANT
NEEDLE MA TROC 1/2 (NEEDLE) ×2 IMPLANT
NS IRRIG 1000ML POUR BTL (IV SOLUTION) ×2 IMPLANT
PACK TOTAL JOINT (CUSTOM PROCEDURE TRAY) ×2 IMPLANT
PASSER SUT SWANSON 36MM LOOP (INSTRUMENTS) IMPLANT
PROTECTOR NERVE ULNAR (MISCELLANEOUS) ×2 IMPLANT
STAPLER SKIN PROX WIDE 3.9 (STAPLE) IMPLANT
STRIP CLOSURE SKIN 1/2X4 (GAUZE/BANDAGES/DRESSINGS) ×4 IMPLANT
SUT ETHIBOND NAB CT1 #1 30IN (SUTURE) ×2 IMPLANT
SUT MNCRL AB 4-0 PS2 18 (SUTURE) ×2 IMPLANT
SUT STRATAFIX 0 PDS 27 VIOLET (SUTURE) ×2
SUT VIC AB 2-0 CT1 TAPERPNT 27 (SUTURE) ×4 IMPLANT
SUTURE STRATFX 0 PDS 27 VIOLET (SUTURE) ×2 IMPLANT
SYR 50ML LL SCALE MARK (SYRINGE) ×2 IMPLANT
TOWEL OR 17X26 10 PK STRL BLUE (TOWEL DISPOSABLE) ×4 IMPLANT

## 2023-06-18 NOTE — Anesthesia Postprocedure Evaluation (Signed)
Anesthesia Post Note  Patient: Carol Sanchez  Procedure(s) Performed: Right hip bursectomy; gluteal tendon repair (Right: Hip) OPEN REPAIR OF GLUTEAL TENDON (Right)     Patient location during evaluation: PACU Anesthesia Type: General Level of consciousness: awake and alert Pain management: pain level controlled Vital Signs Assessment: post-procedure vital signs reviewed and stable Respiratory status: spontaneous breathing, nonlabored ventilation and respiratory function stable Cardiovascular status: blood pressure returned to baseline Postop Assessment: no apparent nausea or vomiting Anesthetic complications: no   No notable events documented.  Last Vitals:  Vitals:   06/18/23 1600 06/18/23 1615  BP: 133/69 135/69  Pulse: 72 73  Resp: 15 10  Temp:    SpO2: 96% 97%    Last Pain:  Vitals:   06/18/23 1615  TempSrc:   PainSc: 4                  Shanda Howells

## 2023-06-18 NOTE — Discharge Instructions (Addendum)
Ollen Gross, MD [facility] [place] [address], [address] [place], [place] [address] [phone]  BURSECTOMY / GLUTEAL TENDON REPAIR POSTOPERATIVE INSTRUCTIONS  HOME CARE INSTRUCTIONS  . Remove items at home which could result in a fall. This includes throw rugs or furniture in walking pathways.   ICE to the affected hip every three hours for 30 minutes at a time and then as needed for pain and swelling. Continue to use ice on the hip for pain and swelling from surgery. You may notice swelling that will progress down to the foot and ankle. This is normal after surgery. Elevate the leg when you are not up walking on it.    Continue to use the breathing machine which will help keep your temperature down. It is common for your temperature to cycle up and down following surgery, especially at night when you are not up moving around and exerting yourself. The breathing machine keeps your lungs expanded and your temperature down. . Sit on high chairs which makes it easier to stand.  . Sit on chairs with arms. Use the chair arms to help push yourself up when arising.   No active abduction of the leg (do not pull leg out to the side away from the body).  Use your walker for first several days until comfortable ambulating.  DIET You may resume your previous home diet once you are discharged from the hospital.  DRESSING / WOUND CARE / SHOWERING . You will have an adhesive waterproof bandage over the incision. Leave this in place until your first follow-up appointment. . You may shower three days after surgery, but do not submerge the incision under water.   ACTIVITY . Walk with your walker as instructed. Marland Kitchen Use walker as long as suggested by your caregivers. . Avoid periods of inactivity such as sitting longer than an hour when not asleep. This helps prevent blood clots.  . You may resume a sexual relationship in one month or when given the OK by your  doctor.  . You may return to work once you are cleared by your doctor.  . Do not drive a car for 6 weeks or until released by you surgeon.  . Do not drive while taking narcotics.  WEIGHT BEARING Weight bearing as tolerated with assist device (walker, cane, etc) as directed, use it until comfortable ambulating.  POSTOPERATIVE CONSTIPATION PROTOCOL Constipation - defined medically as fewer than three stools per week and severe constipation as less than one stool per week.  One of the most common issues patients have following surgery is constipation.  Even if you have a regular bowel pattern at home, your normal regimen is likely to be disrupted due to multiple reasons following surgery.  Combination of anesthesia, postoperative narcotics, change in appetite and fluid intake all can affect your bowels.  In order to avoid complications following surgery, here are some recommendations in order to help you during your recovery period.  Colace (docusate) - Pick up an over-the-counter form of Colace or another stool softener and take twice a day as long as you are requiring postoperative pain medications.  Take with a full glass of water daily.  If you experience loose stools or diarrhea, hold the colace until you stool forms back up.  If your symptoms do not get better within 1 week or if they get worse, check with your doctor.  Dulcolax (bisacodyl) - Pick up over-the-counter and take as directed by the product packaging  as needed to assist with the movement of your bowels.  Take with a full glass of water.  Use this product as needed if not relieved by Colace only.   MiraLax (polyethylene glycol) - Pick up over-the-counter to have on hand.  MiraLax is a solution that will increase the amount of water in your bowels to assist with bowel movements.  Take as directed and can mix with a glass of water, juice, soda, coffee, or tea.  Take if you go more than two days without a movement. Do not use MiraLax  more than once per day. Call your doctor if you are still constipated or irregular after using this medication for 7 days in a row.  If you continue to have problems with postoperative constipation, please contact the office for further assistance and recommendations.  If you experience "the worst abdominal pain ever" or develop nausea or vomiting, please contact the office immediatly for further recommendations for treatment.  ITCHING  If you experience itching with your medications, try taking only a single pain pill, or even half a pain pill at a time.  You can also use Benadryl over the counter for itching or also to help with sleep.   TED HOSE STOCKINGS Wear the elastic stockings on both legs for three weeks following surgery during the day but you may remove then at night for sleeping.  MEDICATIONS See your medication summary on the "After Visit Summary" that the nursing staff will review with you prior to discharge.  You may have some home medications which will be placed on hold until you complete the course of blood thinner medication.  It is important for you to complete the blood thinner medication as prescribed by your surgeon.  Continue your approved medications as instructed at time of discharge.  PRECAUTIONS If you experience chest pain or shortness of breath - call 911 immediately for transfer to the hospital emergency department.  If you develop a fever greater that 101 F, purulent drainage from wound, increased redness or drainage from wound, foul odor from the wound/dressing, or calf pain - CONTACT YOUR SURGEON.                                                   FOLLOW-UP APPOINTMENTS Make sure you keep all of your appointments after your operation with your surgeon and caregivers. You should call the office at the above phone number and make an appointment for approximately two weeks after the date of your surgery or on the date instructed by your surgeon outlined in the "After  Visit Summary".   Pick up stool softner and laxative for home use following surgery while on pain medications. Do not submerge incision under water. Please use good hand washing techniques while changing dressing each day. May shower starting three days after surgery. Please use a clean towel to pat the incision dry following showers. Continue to use ice for pain and swelling after surgery. Do not use any lotions or creams on the incision until instructed by your surgeon.

## 2023-06-18 NOTE — Brief Op Note (Signed)
06/18/2023  2:29 PM  PATIENT:  Carol Sanchez  73 y.o. female  PRE-OPERATIVE DIAGNOSIS:  Right hip bursitis; gluteal tendon tear  POST-OPERATIVE DIAGNOSIS:  Right hip bursitis; gluteal tendon tear  PROCEDURE:  Procedure(s) with comments: Right hip bursectomy; gluteal tendon repair (Right) - OPEN REPAIR OF GLUTEAL TENDON (Right)  SURGEON:  Surgeons and Role:    Ollen Gross, MD - Primary  PHYSICIAN ASSISTANT:   ASSISTANTS: Arther Abbott, PA-C   ANESTHESIA:   general  EBL:  20 ml  BLOOD ADMINISTERED:none  DRAINS: none   LOCAL MEDICATIONS USED:  MARCAINE     COUNTS:  YES  TOURNIQUET:  * No tourniquets in log *  DICTATION: .Other Dictation: Dictation Number [id number]  PLAN OF CARE: Discharge to home after PACU  PATIENT DISPOSITION:  PACU - hemodynamically stable.

## 2023-06-18 NOTE — Anesthesia Procedure Notes (Signed)
Procedure Name: Intubation Date/Time: 06/18/2023 1:41 PM  Performed by: Jamelle Rushing, CRNAPre-anesthesia Checklist: Patient identified, Emergency Drugs available, Suction available, Patient being monitored and Timeout performed Patient Re-evaluated:Patient Re-evaluated prior to induction Oxygen Delivery Method: Circle system utilized Preoxygenation: Pre-oxygenation with 100% oxygen Induction Type: IV induction Ventilation: Mask ventilation without difficulty Laryngoscope Size: Mac and 3 Grade View: Grade II Tube type: Oral Tube size: 7.0 mm Number of attempts: 2 Airway Equipment and Method: Stylet Placement Confirmation: ETT inserted through vocal cords under direct vision, CO2 detector and breath sounds checked- equal and bilateral Secured at: 21 cm Tube secured with: Tape Dental Injury: Teeth and Oropharynx as per pre-operative assessment

## 2023-06-18 NOTE — Op Note (Unsigned)
NAME: Carol Sanchez, KOZUB MEDICAL RECORD NO: [id number] ACCOUNT NO: [id number] DATE OF BIRTH: 03/14/1951 FACILITY: [facility] LOCATION: [facility] PHYSICIAN: Gus Rankin. Ratasha Fabre, MD  Operative Report   DATE OF PROCEDURE: 06/18/2023  PREOPERATIVE DIAGNOSIS:  Right hip intractable bursitis with gluteal tendon tear.  POSTOPERATIVE DIAGNOSIS:  Right hip intractable bursitis with gluteal tendon tear.  PROCEDURE PERFORMED: 1.  Right hip bursectomy. 2.  Gluteal tendon repair.  SURGEON:  Gus Rankin. Alexes Lamarque, MD  ASSISTANT:  Jeanett Schlein, PA-C  ANESTHESIA:  General  ESTIMATED BLOOD LOSS:  20 mL  DRAINS:  None  COMPLICATIONS:  None  CONDITION:  Stable to recovery.  BRIEF CLINICAL NOTE:  The patient is a 73 year old female with intractable right hip pain.  She has had nonoperative management including injections without benefit.  She has had progressively worsening pain and dysfunction.  Recent MRI showed a gluteus  medius tear as well as tremendous amount of bursitis.  She presents now for bursectomy and gluteal tendon repair.  DESCRIPTION OF PROCEDURE:  After successful administration of general anesthetic, the patient was placed in the left lateral decubitus position with the right side up and held with the hip positioner.  The right thigh was then isolated and prepped and  draped in the usual sterile fashion.  About 4-inch incision was made centered at the tip of the greater trochanter.  Skin cut with a 10 blade through the subcutaneous tissue to the fascia lata, which was incised in line with the skin incision.  A large  amount of fluid was encountered upon incising the fascia lata.  There was a tremendous amount of inflamed hypertrophic bursal tissue, which I dissected off of the femur and off of the gluteal tendons.  She had a very large tear of the gluteus medius off  of the bone with slight retraction.  It was in the central and anterior portions, so about two-thirds of the tendon was torn  off the bone.  I was able to find the free edge and then pass two SutureTapes through the full free edge so that it would advance  down to the greater trochanter.  The sutures were passed and then passed through the SwiveLock.  I then roughened up the bone on the surface of the greater trochanter and did the punch for the SwiveLock anchor.  We then passed the anchor into the hole  created by the punch and tightened it down to effectively advance the tendon and give a stable repair.  I then oversewed the tendon with the sutures that we passed through.  This led to a very stable repair.  We then irrigated with saline solution and  closed the fascia lata with a running 0 Stratafix suture.  30 mL of 0.25% Marcaine was injected into the subcutaneous tissues.  Subcutaneous closed in two layers deep and superficial with interrupted 2-0 Vicryl.  Subcuticular was closed with running 4-0  Monocryl.  Incision was cleaned and dried and Dermabond applied.  A sterile dressing was applied and she is awakened and transported to recovery in stable condition.   PUS D: 06/18/2023 2:37:36 pm T: 06/18/2023 3:08:00 pm  JOB: [id number]/ [id number]

## 2023-06-18 NOTE — Transfer of Care (Signed)
Immediate Anesthesia Transfer of Care Note  Patient: Carol Sanchez  Procedure(s) Performed: Right hip bursectomy; gluteal tendon repair (Right: Hip) OPEN REPAIR OF GLUTEAL TENDON (Right)  Patient Location: PACU  Anesthesia Type:General  Level of Consciousness: awake and alert   Airway & Oxygen Therapy: Patient Spontanous Breathing and Patient connected to face mask oxygen  Post-op Assessment: Report given to RN and Post -op Vital signs reviewed and stable  Post vital signs: Reviewed and stable  Last Vitals:  Vitals Value Taken Time  BP 139/66 06/18/23 1451  Temp    Pulse 75 06/18/23 1459  Resp 14 06/18/23 1459  SpO2 100 % 06/18/23 1459  Vitals shown include unfiled device data.  Last Pain:  Vitals:   06/18/23 1329  TempSrc: Oral  PainSc: 10-Worst pain ever      Patients Stated Pain Goal: 4 (06/18/23 1200)  Complications: No notable events documented.

## 2023-06-18 NOTE — H&P (Signed)
CC- Carol Sanchez is a 73 y.o. female who presents with right hip pain  Hip Pain: Patient complains of right hip pain. Onset of the symptoms was several months ago. Inciting event: none. Current symptoms include  lateral hip pain at rest and with activity . Aggravating symptoms: any weight bearing, pivoting, and rising after sitting. . Evaluation to date:  MRI showed gluteus medius tear .  Treatment to date:  injection with short term partial benefit .  Past Medical History:  Diagnosis Date   Anemia    in the past, r/o hiatal hernia   Aortic calcification (HCC)    Cancer (HCC) 2003   thyroid   Carotid artery disease (HCC)    Complication of anesthesia    very sensitive to anesthesia   GERD (gastroesophageal reflux disease)    before gatric bypass   History of kidney stones    Hypertension    Pre-diabetes    Metobolic Syndrome    Past Surgical History:  Procedure Laterality Date   ABDOMINAL HYSTERECTOMY     BUNIONECTOMY     left foot   GASTRIC ROUX-EN-Y N/A 01/22/2018   Procedure: LAPAROSCOPIC ROUX-EN-Y GASTRIC BYPASS WITH UPPER ENDOSCOPY;  Surgeon: Luretha Murphy, MD;  Location: [facility];  Service: General;  Laterality: N/A;   HIATAL HERNIA REPAIR N/A 01/22/2018   Procedure: LAPAROSCOPIC REPAIR OF HIATAL HERNIA;  Surgeon: Luretha Murphy, MD;  Location: [facility];  Service: General;  Laterality: N/A;   LIPOMA EXCISION     LITHOTRIPSY     THYROIDECTOMY  2003   TUBAL LIGATION      Prior to Admission medications   Medication Sig Start Date End Date Taking? Authorizing Provider  acetaminophen (TYLENOL) 500 MG tablet Take 1,000 mg by mouth 2 (two) times daily as needed for headache.   Yes [provider]  aspirin EC 81 MG tablet Take 1 tablet (81 mg total) by mouth daily. Swallow whole. 04/26/22  Yes Kathleene Hazel, MD  Calcium Carbonate-Vitamin D (CALCIUM-D PO) Take 1 tablet by mouth in the morning, at noon, and at bedtime.   Yes [provider]   diclofenac sodium (VOLTAREN) 1 % GEL Apply 1 application topically daily as needed (pain).   Yes [provider]  EPINEPHrine 0.3 mg/0.3 mL IJ SOAJ injection Inject 0.3 mg into the muscle as needed for anaphylaxis. 05/24/22  Yes [provider]  hydrochlorothiazide (HYDRODIURIL) 25 MG tablet Take 12.5 mg by mouth daily.   Yes [provider]  Multiple Vitamin (MULTIVITAMIN WITH MINERALS) TABS tablet Take 1 tablet by mouth daily.   Yes [provider]  pravastatin (PRAVACHOL) 20 MG tablet Take 1 tablet (20 mg total) by mouth every evening. Please keep scheduled appointment for future refills. Thank you. 04/17/23  Yes Kathleene Hazel, MD  PRESCRIPTION MEDICATION Inject 15 Units into the skin once a week. Compounded Semaglutide- (Mondays)  unknown dose.   Yes [provider]  SYNTHROID 112 MCG tablet Take 112 mcg by mouth every morning. 09/27/20  Yes [provider]  zolpidem (AMBIEN) 5 MG tablet Take 2.5-5 mg by mouth at bedtime as needed for sleep.   Yes [provider]    Physical Examination: General appearance - alert, well appearing, and in no distress Mental status - alert, oriented to person, place, and time Chest - clear to auscultation, no wheezes, rales or rhonchi, symmetric air entry Heart - normal rate, regular rhythm, normal S1, S2, no murmurs, rubs, clicks or gallops Abdomen -  soft, nontender, nondistended, no masses or organomegaly Neurological - alert, oriented, normal speech, no focal findings or movement disorder noted  A right hip exam was performed. GENERAL: no acute distress SKIN: intact SWELLING: none WARMTH: no warmth TENDERNESS: maximal at greater trochanter ROM: normal STRENGTH: normal GAIT: antalgic  ASSESSMENT:Right hip pain due to gluteal tendon tear and bursitis  Plan Right hip bursectomy and gluteal tendon repair  Gus Rankin. Namrata Dangler, MD    06/18/2023, 12:40 PM

## 2023-06-19 ENCOUNTER — Encounter (HOSPITAL_COMMUNITY): Payer: Self-pay | Admitting: Orthopedic Surgery

## 2023-06-21 ENCOUNTER — Ambulatory Visit: Payer: Medicare Other | Admitting: Cardiovascular Disease

## 2023-07-09 DIAGNOSIS — R7303 Prediabetes: Secondary | ICD-10-CM | POA: Diagnosis not present

## 2023-07-09 DIAGNOSIS — Z85850 Personal history of malignant neoplasm of thyroid: Secondary | ICD-10-CM | POA: Diagnosis not present

## 2023-07-09 DIAGNOSIS — Z9884 Bariatric surgery status: Secondary | ICD-10-CM | POA: Diagnosis not present

## 2023-07-09 DIAGNOSIS — E890 Postprocedural hypothyroidism: Secondary | ICD-10-CM | POA: Diagnosis not present

## 2023-07-09 DIAGNOSIS — E785 Hyperlipidemia, unspecified: Secondary | ICD-10-CM | POA: Diagnosis not present

## 2023-07-30 ENCOUNTER — Ambulatory Visit: Payer: Medicare Other | Admitting: Cardiovascular Disease

## 2023-08-02 DIAGNOSIS — H6121 Impacted cerumen, right ear: Secondary | ICD-10-CM | POA: Diagnosis not present

## 2023-08-06 DIAGNOSIS — K5730 Diverticulosis of large intestine without perforation or abscess without bleeding: Secondary | ICD-10-CM | POA: Diagnosis not present

## 2023-08-06 DIAGNOSIS — R109 Unspecified abdominal pain: Secondary | ICD-10-CM | POA: Diagnosis not present

## 2023-08-06 DIAGNOSIS — N200 Calculus of kidney: Secondary | ICD-10-CM | POA: Diagnosis not present

## 2023-08-06 DIAGNOSIS — K449 Diaphragmatic hernia without obstruction or gangrene: Secondary | ICD-10-CM | POA: Diagnosis not present

## 2023-08-06 DIAGNOSIS — R1084 Generalized abdominal pain: Secondary | ICD-10-CM | POA: Diagnosis not present

## 2023-08-06 DIAGNOSIS — R310 Gross hematuria: Secondary | ICD-10-CM | POA: Diagnosis not present

## 2023-08-07 ENCOUNTER — Other Ambulatory Visit: Payer: Self-pay | Admitting: Urology

## 2023-08-07 ENCOUNTER — Encounter (HOSPITAL_COMMUNITY): Payer: Self-pay | Admitting: Urology

## 2023-08-07 NOTE — Progress Notes (Signed)
 Spoke w/ via phone for pre-op interview--- Lab needs dos--KUB         Lab results------ COVID test --patient states asymptomatic no test needed Arrive at ---1030 NPO after MN NO Solid Food.  Clear liquids from MN until 0630 Pre-Surgery Ensure or G2:  Med rec completed Medications to take morning of surgery --synthroid, hydrodiuril, may continure pravachol and ambien Diabetic medication -----  GLP1 agonist last dose: GLP1 instructions:  Patient instructed no nail polish to be worn day of surgery Patient instructed to bring photo id and insurance card day of surgery Patient aware to have Driver (ride ) / caregiver    for 24 hours after surgery - dtrs- Fleet Contras and Lauren Patient Special Instructions ----- Pre-Op special Instructions --per Litho  Patient verbalized understanding of instructions that were given at this phone interview. Patient denies chest pain, sob, fever, cough at the interview.

## 2023-08-10 ENCOUNTER — Encounter (HOSPITAL_COMMUNITY)
Admission: RE | Disposition: A | Discharge status: Home / Self Care | Payer: Self-pay | Source: Home / Self Care | Attending: Urology

## 2023-08-10 ENCOUNTER — Other Ambulatory Visit: Payer: Self-pay

## 2023-08-10 ENCOUNTER — Ambulatory Visit (HOSPITAL_COMMUNITY)

## 2023-08-10 ENCOUNTER — Ambulatory Visit (HOSPITAL_COMMUNITY)
Admission: RE | Admit: 2023-08-10 | Discharge: 2023-08-10 | Disposition: A | Discharge status: Home / Self Care | Attending: Urology | Admitting: Urology

## 2023-08-10 ENCOUNTER — Encounter (HOSPITAL_COMMUNITY): Payer: Self-pay | Admitting: Urology

## 2023-08-10 DIAGNOSIS — N200 Calculus of kidney: Secondary | ICD-10-CM | POA: Insufficient documentation

## 2023-08-10 HISTORY — PX: EXTRACORPOREAL SHOCK WAVE LITHOTRIPSY: SHX1557

## 2023-08-10 SURGERY — LITHOTRIPSY, ESWL
Anesthesia: LOCAL | Laterality: Left

## 2023-08-10 MED ORDER — CIPROFLOXACIN HCL 500 MG PO TABS
500.0000 mg | ORAL_TABLET | ORAL | Status: AC
  Administered 2023-08-10: 500 mg via ORAL
  Filled 2023-08-10: qty 1

## 2023-08-10 MED ORDER — DIAZEPAM 5 MG PO TABS
ORAL_TABLET | ORAL | Status: AC
  Administered 2023-08-10: 5 mg via ORAL
  Filled 2023-08-10: qty 1

## 2023-08-10 MED ORDER — DIAZEPAM 5 MG PO TABS: 5.0000 mg | ORAL_TABLET | Freq: Once | ORAL | Status: AC

## 2023-08-10 MED ORDER — SODIUM CHLORIDE 0.9 % IV SOLN
INTRAVENOUS | Status: DC
Start: 2023-08-10 — End: 2023-08-10

## 2023-08-10 MED ORDER — DIPHENHYDRAMINE HCL 25 MG PO CAPS
25.0000 mg | ORAL_CAPSULE | ORAL | Status: AC
  Administered 2023-08-10: 25 mg via ORAL
  Filled 2023-08-10: qty 1

## 2023-08-10 MED ORDER — ONDANSETRON HCL 4 MG PO TABS: 4.0000 mg | ORAL_TABLET | Freq: Every day | ORAL | 1 refills | Status: AC | PRN

## 2023-08-10 MED ORDER — DIAZEPAM 5 MG PO TABS
ORAL_TABLET | ORAL | Status: AC
  Filled 2023-08-10: qty 1

## 2023-08-10 MED ORDER — DIAZEPAM 5 MG PO TABS: 10.0000 mg | ORAL_TABLET | ORAL | Status: DC

## 2023-08-10 MED ORDER — OXYCODONE-ACETAMINOPHEN 5-325 MG PO TABS: 1.0000 | ORAL_TABLET | ORAL | 0 refills | Status: DC | PRN

## 2023-08-10 MED ORDER — TAMSULOSIN HCL 0.4 MG PO CAPS: 0.4000 mg | ORAL_CAPSULE | Freq: Every day | ORAL | 0 refills | Status: AC

## 2023-08-10 NOTE — H&P (Signed)
See scanned [facility] documents for H&P.   

## 2023-08-10 NOTE — Discharge Instructions (Signed)

## 2023-08-10 NOTE — Op Note (Signed)
 ESWL Operative Note  Treating Physician: Rhoderick Moody, MD  Pre-op diagnosis: 1.1 cm left renal stone  Post-op diagnosis: Same   Procedure: LEFT ESWL  See [facility] OP note scanned into chart. Also because of the size, density, location and other factors that cannot be anticipated I feel this will likely be a staged procedure. This fact supersedes any indication in the scanned [facility] operative note to the contrary

## 2023-08-13 ENCOUNTER — Encounter (HOSPITAL_COMMUNITY): Payer: Self-pay | Admitting: Urology

## 2023-08-14 DIAGNOSIS — M6281 Muscle weakness (generalized): Secondary | ICD-10-CM | POA: Diagnosis not present

## 2023-08-14 DIAGNOSIS — M25561 Pain in right knee: Secondary | ICD-10-CM | POA: Diagnosis not present

## 2023-08-16 DIAGNOSIS — M25561 Pain in right knee: Secondary | ICD-10-CM | POA: Diagnosis not present

## 2023-08-16 DIAGNOSIS — M6281 Muscle weakness (generalized): Secondary | ICD-10-CM | POA: Diagnosis not present

## 2023-08-17 ENCOUNTER — Encounter (HOSPITAL_COMMUNITY): Payer: Self-pay | Admitting: *Deleted

## 2023-08-20 ENCOUNTER — Other Ambulatory Visit: Payer: Self-pay | Admitting: Urology

## 2023-08-20 DIAGNOSIS — N201 Calculus of ureter: Secondary | ICD-10-CM | POA: Diagnosis not present

## 2023-08-21 ENCOUNTER — Encounter (HOSPITAL_COMMUNITY): Payer: Self-pay | Admitting: Urology

## 2023-08-21 ENCOUNTER — Other Ambulatory Visit: Payer: Self-pay

## 2023-08-21 DIAGNOSIS — M6281 Muscle weakness (generalized): Secondary | ICD-10-CM | POA: Diagnosis not present

## 2023-08-21 DIAGNOSIS — M25561 Pain in right knee: Secondary | ICD-10-CM | POA: Diagnosis not present

## 2023-08-21 NOTE — H&P (Signed)
 CC/HPI: cc: left flank pain, gross hematuria   08/06/23: 73 year old woman comes in with 6 weeks of intermittent left flank pain and gross hematuria. Urinalysis today consistent with microscopic hematuria. She has a remote history of stones and had lithotripsy in the 1980s. She feels like this is another stone. No fevers, chills, nausea or vomiting.   08/20/2023: Carol Sanchez is a 73 year old female who underwent left-sided lithotripsy 4 days ago and presents today with concerns of left flank pain, urgency and frequency. She is only passed a small amount of stone material. She denies fevers and chills. She has not been on her semaglutide in almost 2 weeks.     ALLERGIES: Penicillin Sulfa    MEDICATIONS: hydroCHLOROthiazide 12.5 MG Tablet  Tamsulosin HCl 0.4 MG Capsule  Aspirin 81 MG Tablet Delayed Release  Calcium  Pravastatin Sodium 20 MG Tablet  Semaglutide  Synthroid 137 MCG Tablet  Vitamin D3     GU PSH: No GU PSH      PSH Notes: Bursectomy tendon repair 2025  Bladder lift 2021   NON-GU PSH: Gastric bypass Hysterectomy - about 2021 Thyroidectomy     GU PMH: Flank Pain - 08/06/2023 Gross hematuria - 08/06/2023 Renal calculus - 08/06/2023      PMH Notes: Thyroid cancer    NON-GU PMH: Arthritis Cardiac murmur, unspecified GERD Hypercholesterolemia Hypertension    FAMILY HISTORY: CABG - Brother   SOCIAL HISTORY: Marital Status: Divorced Preferred Language: English; Ethnicity: Not Hispanic Or Latino; Race: White Current Smoking Status: Patient does not smoke anymore.   Tobacco Use Assessment Completed: Used Tobacco in last 30 days? Does not drink anymore.  Drinks 1 caffeinated drink per day.    REVIEW OF SYSTEMS:    GU Review Female:   Patient reports frequent urination and hard to postpone urination. Patient denies burning /pain with urination, get up at night to urinate, leakage of urine, stream starts and stops, trouble starting your stream, have to strain to urinate,  and being pregnant.  Gastrointestinal (Upper):   Patient denies nausea, vomiting, and indigestion/ heartburn.  Gastrointestinal (Lower):   Patient denies diarrhea and constipation.  Constitutional:   Patient denies fever, night sweats, weight loss, and fatigue.  Skin:   Patient denies skin rash/ lesion and itching.  Ears/ Nose/ Throat:   Patient denies sinus problems and sore throat.  Cardiovascular:   Patient denies leg swelling and chest pains.  Musculoskeletal:   Patient denies back pain and joint pain.  Neurological:   Patient denies headaches and dizziness.  Psychologic:   Patient denies depression and anxiety.   VITAL SIGNS:      08/20/2023 10:50 AM  BP 135/76 mmHg  Pulse 86 /min  Temperature 97.7 F / 36.5 C   MULTI-SYSTEM PHYSICAL EXAMINATION:    Constitutional: Well-nourished. No physical deformities. Normally developed. Good grooming.  Respiratory: No labored breathing, no use of accessory muscles.   Cardiovascular: Normal temperature, normal extremity pulses, no swelling, no varicosities.  Skin: No paleness, no jaundice, no cyanosis. No lesion, no ulcer, no rash.  Neurologic / Psychiatric: Oriented to time, oriented to place, oriented to person. No depression, no anxiety, no agitation.  Gastrointestinal: No mass, no tenderness, no rigidity, non obese abdomen.     Complexity of Data:  Source Of History:  Patient  Records Review:   Previous Doctor Records, Previous Patient Records  Urine Test Review:   Urinalysis  X-Ray Review: KUB: Reviewed Films. Reviewed Report. Discussed With Patient.     08/20/23  Urinalysis  Urine Appearance Slightly Cloudy   Urine Color Yellow   Urine Glucose Neg mg/dL  Urine Bilirubin Neg mg/dL  Urine Ketones Neg mg/dL  Urine Specific Gravity 1.025   Urine Blood 3+ ery/uL  Urine pH 6.0   Urine Protein 1+ mg/dL  Urine Urobilinogen 1.0 mg/dL  Urine Nitrites Neg   Urine Leukocyte Esterase Trace leu/uL  Urine WBC/hpf 0 - 5/hpf   Urine  RBC/hpf 10 - 20/hpf   Urine Epithelial Cells 0 - 5/hpf   Urine Bacteria NS (Not Seen)   Urine Mucous Present   Urine Yeast NS (Not Seen)   Urine Trichomonas Not Present   Urine Cystals NS (Not Seen)   Urine Casts NS (Not Seen)   Urine Sperm Not Present    PROCEDURES:         KUB - [id number]  A single view of the abdomen is obtained. Bilateral renal shadows are visualized. Within the left renal shadow near the proximal left ureter, there is a 3 to 4 mm opacity. Tracing along the left ureter there is another 4 to 5 mm opacity in the distal ureter with a line of stones representing Steinstrasse.      Patient confirmed No Neulasta OnPro Device.           Urinalysis w/Scope Dipstick Dipstick Cont'd Micro  Color: Yellow Bilirubin: Neg mg/dL WBC/hpf: 0 - 5/hpf  Appearance: Slightly Cloudy Ketones: Neg mg/dL RBC/hpf: 10 - 64/PPI  Specific Gravity: 1.025 Blood: 3+ ery/uL Bacteria: NS (Not Seen)  pH: 6.0 Protein: 1+ mg/dL Cystals: NS (Not Seen)  Glucose: Neg mg/dL Urobilinogen: 1.0 mg/dL Casts: NS (Not Seen)    Nitrites: Neg Trichomonas: Not Present    Leukocyte Esterase: Trace leu/uL Mucous: Present      Epithelial Cells: 0 - 5/hpf      Yeast: NS (Not Seen)      Sperm: Not Present    Notes: microscopic completed on unspun sample due to QNS    ASSESSMENT:      ICD-10 Details  1 GU:   Flank Pain - R10.84 Left, Chronic, Exacerbation  2   Ureteral calculus - N20.1 Left, Acute, Systemic Symptoms   PLAN:           Orders Labs CULTURE, URINE          Schedule Return Visit/Planned Activity: Next Available Appointment - Schedule Surgery          Document Letter(s):  Created for Patient: Clinical Summary         Notes:   Urine has microscopic hematuria but is not concerning for infection. However I will send for precautionary culture. I discussed options for Steinstrasse including medical expulsive therapy and ureteroscopy. The role of ureteroscopy was discussed including the risk for  injury to ureter, infection, bleeding and the general risks of anesthesia. She would like to pursue ureteroscopy. A green sheet was placed. Strict return precautions were advised.

## 2023-08-21 NOTE — Progress Notes (Addendum)
 COVID Vaccine Completed:  Yes  Date of COVID positive in last 90 days:  No  PCP - Fatima Sanger, FNP Cardiologist - Earney Hamburg, MD  Chest x-ray - N/A EKG - 06-14-23 Epic Stress Test - N/A ECHO - 05-22-23 Epic Cardiac Cath - N/A Pacemaker/ICD device last checked: Spinal Cord Stimulator:  N/A Coronary CT - 05-30-22 Epic  Bowel Prep - N/A  Sleep Study - N/A CPAP -   Fasting Blood Sugar - N/A Checks Blood Sugar _____ times a day  Semaglutide for weight loss Last dose of GLP1 agonist-  08-13-23 GLP1 instructions:  Hold until after surgery   Last dose of SGLT-2 inhibitors-  N/A SGLT-2 instructions:  Hold 3 days before surgery    Blood Thinner Instructions:  N/A Aspirin Instructions:   ASA 81 Last Dose: 08-05-23  Activity level:  Can go up a flight of stairs and perform activities of daily living without stopping and without symptoms of chest pain or shortness of breath.  Anesthesia review:  CAD, Moderate aortic stenosis  Patient denies shortness of breath, fever, cough and chest pain at PAT appointment (completed over the phone)  Patient verbalized understanding of instructions that were given to them at the PAT appointment. Patient was also instructed that they will need to review over the PAT instructions again at home before surgery.

## 2023-08-21 NOTE — Progress Notes (Signed)
 Case: [id number] Date/Time: 08/23/23 1145   Procedures:      CYSTOSCOPY/URETEROSCOPY/HOLMIUM LASER/STENT PLACEMENT (Left) - CYSTOSCOPY/LEFT URETEROSCOPY/HOLMIUM LASER LITHOTRIPSY/STENT PLACEMENT/RETROGRADE PYELOGRAM     CYSTOSCOPY, WITH RETROGRADE PYELOGRAM (Left)   Anesthesia type: General   Diagnosis: Ureteral stone [N20.1]   Pre-op diagnosis: LEFT STEINSTRAUSS   [facility] 10 [facility]   Surgeons: Noel Christmas, MD       DISCUSSION: Carol Sanchez is a 73 year old female who is being evaluated as SDW prior to surgery above.  Past medical history significant for former smoking, hypertension, CAD (mild by CTA), aortic atherosclerosis, moderate aortic stenosis, moderate mitral regurgitation, mild carotid artery disease, history of thyroid cancer status post thyroidectomy (2003), GERD, hiatal hernia status post repair (2019), s/p gastric bypass (2019).  Prior complications from anesthesia is at patient is reportedly "sensitive" to medications. Patient underwent R hip surgery on 06/18/23 without complications. Patient also underwent lithotripsy on 08/10/23 on the mobile lithotripsy unit.  Patient follows with cardiac allergy for history of CAD, aortic calcifications, and aortic stenosis.  Last seen on 06/15/2023.  She denies any cardiac symptoms.  Prior CTA of the coronaries in 05/2022 showed mild CAD.  She has on medical therapy for this.  She has moderate aortic stenosis and mitral regurgitation by echo in December 2024.  Cleared for hip surgery by Dr. Clifton James.  Anticipate she is ok to proceed as she denied any new cardiac symptoms at PAT visit.   Last dose of GLP1 agonist -  08-13-23  VS: Ht 5' (1.524 m)   Wt 83.5 kg   BMI 35.94 kg/m   PROVIDERS: Fatima Sanger, FNP Cardiologist - Earney Hamburg, MD  LABS: CBC and BMP for DOS  IMAGES: CT chest (over read) 03/24/2022:   IMPRESSION: 1. No acute noncardiac cardiopulmonary findings. 2. Moderate-sized hiatal hernia.    EKG 06/14/2023 Normal sinus rhythm rate 73 Possible Anterolateral infarct , age undetermined   CV: Echo 05/22/2023:   IMPRESSIONS      1. Fixed right coronary cusp with moderate AS (mean gradient 24 mmHg; AVA 1.05 cm2).  2. Left ventricular ejection fraction, by estimation, is 60 to 65%. The left ventricle has normal function. The left ventricle has no regional wall motion abnormalities. There is moderate left ventricular hypertrophy of the basal-septal segment. Left ventricular diastolic parameters are consistent with Grade II diastolic dysfunction (pseudonormalization). The average left ventricular global longitudinal strain is 20.2 %. The global longitudinal strain is normal.  3. Right ventricular systolic function is normal. The right ventricular size is normal.  4. The mitral valve is normal in structure. Moderate mitral valve regurgitation. No evidence of mitral stenosis.  5. The aortic valve is normal in structure. Aortic valve regurgitation is mild. Moderate aortic valve stenosis.  6. The inferior vena cava is normal in size with greater than 50% respiratory variability, suggesting right atrial pressure of 3 mmHg.   CTA coronary 05/30/2022:   IMPRESSION: 1.  Minimal nonobstructive CAD, CADRADS = 1.   2. Coronary calcium score of 6. This was 45th percentile for age and sex matched control.   3. Normal coronary origin with right dominance.   4. Hiatal hernia   5. Mild to moderate calcification and thickening of the aortic valve.   INTERPRETATION:   CAD-RADS 1: Minimal non-obstructive CAD (1-24%). Consider non-atherosclerotic causes of chest pain. Consider preventive therapy and risk factor modification.     CT calcium score 03/24/2022: IMPRESSION: 1. Coronary calcium score of 17.4. This was 52nd  percentile for age-, race-, and sex-matched controls.   2. Aortic valve calcium score 1044.  Past Medical History:  Diagnosis Date   Anemia    in the past, r/o  hiatal hernia   Aortic calcification (HCC)    Cancer (HCC) 2003   thyroid   Carotid artery disease (HCC)    Complication of anesthesia    very sensitive to anesthesia   GERD (gastroesophageal reflux disease)    before gatric bypass   History of kidney stones    Hypertension    Pre-diabetes    Metobolic Syndrome    Past Surgical History:  Procedure Laterality Date   ABDOMINAL HYSTERECTOMY     BUNIONECTOMY     left foot   EXCISION/RELEASE BURSA HIP Right 06/18/2023   Procedure: Right hip bursectomy; gluteal tendon repair;  Surgeon: Ollen Gross, MD;  Location: [facility];  Service: Orthopedics;  Laterality: Right;    EXTRACORPOREAL SHOCK WAVE LITHOTRIPSY Left 08/10/2023   Procedure: LITHOTRIPSY, ESWL;  Surgeon: Rene Paci, MD;  [facility];  Service: Urology;  Laterality: Left;  LEFT EXTRACORPOREAL SHOCKWAVE LITHOTRIPSY   GASTRIC ROUX-EN-Y N/A 01/22/2018   Procedure: LAPAROSCOPIC ROUX-EN-Y GASTRIC BYPASS WITH UPPER ENDOSCOPY;  Surgeon: Luretha Murphy, MD;  [facility];  Service: General;  Laterality: N/A;   HIATAL HERNIA REPAIR N/A 01/22/2018   Procedure: LAPAROSCOPIC REPAIR OF HIATAL HERNIA;  Surgeon: Luretha Murphy, MD;  [facility];  Service: General;  Laterality: N/A;   LIPOMA EXCISION     LITHOTRIPSY     OPEN SURGICAL REPAIR OF GLUTEAL TENDON Right 06/18/2023   Procedure: OPEN REPAIR OF GLUTEAL TENDON;  Surgeon: Ollen Gross, MD;  [facility];  Service: Orthopedics;  Laterality: Right;   THYROIDECTOMY  2003   TUBAL LIGATION      MEDICATIONS: No current facility-administered medications for this encounter.    acetaminophen (TYLENOL) 500 MG tablet   EPINEPHrine 0.3 mg/0.3 mL IJ SOAJ injection   hydrochlorothiazide (HYDRODIURIL) 25 MG tablet   pravastatin (PRAVACHOL) 20 MG tablet   SEMAGLUTIDE-WEIGHT MANAGEMENT [place]   SYNTHROID 112 MCG tablet   tamsulosin (FLOMAX) 0.4 MG CAPS capsule   zolpidem (AMBIEN) 5 MG tablet   aspirin EC 81  MG tablet   Calcium Carbonate-Vitamin D (CALCIUM-D PO)   Multiple Vitamin (MULTIVITAMIN WITH MINERALS) TABS tablet   ondansetron (ZOFRAN) 4 MG tablet   [name], PA-C [facility] Short Stay/Anesthesiology [facility] Phone [phone] 08/21/2023 11:31 AM

## 2023-08-21 NOTE — Anesthesia Preprocedure Evaluation (Signed)
 Anesthesia Evaluation  Patient identified by MRN, date of birth, ID band Patient awake    Reviewed: Allergy & Precautions, NPO status , Patient's Chart, lab work & pertinent test results  History of Anesthesia Complications Negative for: history of anesthetic complications  Airway Mallampati: II  TM Distance: >3 FB Neck ROM: Full    Dental no notable dental hx.    Pulmonary former smoker   Pulmonary exam normal        Cardiovascular hypertension, Normal cardiovascular exam  Echo 05/22/2023:  1. Fixed right coronary cusp with moderate AS (mean gradient 24 mmHg; AVA 1.05 cm2).  2. Left ventricular ejection fraction, by estimation, is 60 to 65%. The left ventricle has normal function. The left ventricle has no regional wall motion abnormalities. There is moderate left ventricular hypertrophy of the basal-septal segment. Left ventricular diastolic parameters are consistent with Grade II diastolic dysfunction (pseudonormalization). The average left ventricular global longitudinal strain is 20.2 %. The global longitudinal strain is normal.  3. Right ventricular systolic function is normal. The right ventricular size is normal.  4. The mitral valve is normal in structure. Moderate mitral valve regurgitation. No evidence of mitral stenosis.  5. The aortic valve is normal in structure. Aortic valve regurgitation is mild. Moderate aortic valve stenosis.  6. The inferior vena cava is normal in size with greater than 50% respiratory variability, suggesting right atrial pressure of 3 mmHg.    Neuro/Psych    GI/Hepatic hiatal hernia,GERD  ,,  Endo/Other  Hypothyroidism    Renal/GU  Ureteral stone     Musculoskeletal   Abdominal   Peds  Hematology   Anesthesia Other Findings   Reproductive/Obstetrics                             Anesthesia Physical Anesthesia Plan  ASA: 3  Anesthesia Plan: General    Post-op Pain Management: Tylenol PO (pre-op)*   Induction: Intravenous  PONV Risk Score and Plan: 3 and Treatment may vary due to age or medical condition, Ondansetron and Dexamethasone  Airway Management Planned: LMA  Additional Equipment: None  Intra-op Plan:   Post-operative Plan: Extubation in OR  Informed Consent: I have reviewed the patients History and Physical, chart, labs and discussed the procedure including the risks, benefits and alternatives for the proposed anesthesia with the patient or authorized representative who has indicated his/her understanding and acceptance.     Dental advisory given  Plan Discussed with: CRNA  Anesthesia Plan Comments: (See PAT note from 3/24)        Anesthesia Quick Evaluation

## 2023-08-23 ENCOUNTER — Ambulatory Visit (HOSPITAL_COMMUNITY)
Admission: RE | Admit: 2023-08-23 | Discharge: 2023-08-23 | Disposition: A | Discharge status: Home / Self Care | Attending: Urology | Admitting: Urology

## 2023-08-23 ENCOUNTER — Ambulatory Visit (HOSPITAL_COMMUNITY)

## 2023-08-23 ENCOUNTER — Encounter (HOSPITAL_COMMUNITY)
Admission: RE | Disposition: A | Discharge status: Home / Self Care | Payer: Self-pay | Source: Home / Self Care | Attending: Urology

## 2023-08-23 ENCOUNTER — Ambulatory Visit (HOSPITAL_COMMUNITY): Payer: Self-pay | Admitting: Medical

## 2023-08-23 ENCOUNTER — Encounter (HOSPITAL_COMMUNITY): Payer: Self-pay | Admitting: Urology

## 2023-08-23 ENCOUNTER — Other Ambulatory Visit: Payer: Self-pay

## 2023-08-23 ENCOUNTER — Ambulatory Visit (HOSPITAL_BASED_OUTPATIENT_CLINIC_OR_DEPARTMENT_OTHER): Payer: Self-pay | Admitting: Medical

## 2023-08-23 DIAGNOSIS — R310 Gross hematuria: Secondary | ICD-10-CM | POA: Diagnosis not present

## 2023-08-23 DIAGNOSIS — Z9884 Bariatric surgery status: Secondary | ICD-10-CM | POA: Insufficient documentation

## 2023-08-23 DIAGNOSIS — Z87891 Personal history of nicotine dependence: Secondary | ICD-10-CM | POA: Insufficient documentation

## 2023-08-23 DIAGNOSIS — N201 Calculus of ureter: Secondary | ICD-10-CM | POA: Insufficient documentation

## 2023-08-23 DIAGNOSIS — E039 Hypothyroidism, unspecified: Secondary | ICD-10-CM

## 2023-08-23 DIAGNOSIS — D649 Anemia, unspecified: Secondary | ICD-10-CM

## 2023-08-23 DIAGNOSIS — I080 Rheumatic disorders of both mitral and aortic valves: Secondary | ICD-10-CM | POA: Diagnosis not present

## 2023-08-23 DIAGNOSIS — I119 Hypertensive heart disease without heart failure: Secondary | ICD-10-CM | POA: Diagnosis not present

## 2023-08-23 DIAGNOSIS — I700 Atherosclerosis of aorta: Secondary | ICD-10-CM | POA: Insufficient documentation

## 2023-08-23 DIAGNOSIS — I10 Essential (primary) hypertension: Secondary | ICD-10-CM

## 2023-08-23 DIAGNOSIS — K219 Gastro-esophageal reflux disease without esophagitis: Secondary | ICD-10-CM | POA: Insufficient documentation

## 2023-08-23 DIAGNOSIS — I2510 Atherosclerotic heart disease of native coronary artery without angina pectoris: Secondary | ICD-10-CM | POA: Insufficient documentation

## 2023-08-23 HISTORY — PX: CYSTOSCOPY/URETEROSCOPY/HOLMIUM LASER/STENT PLACEMENT: SHX6546

## 2023-08-23 LAB — CBC
HCT: 42.6 % (ref 36.0–46.0)
Hemoglobin: 13.6 g/dL (ref 12.0–15.0)
MCH: 29.4 pg (ref 26.0–34.0)
MCHC: 31.9 g/dL (ref 30.0–36.0)
MCV: 92 fL (ref 80.0–100.0)
Platelets: 299 10*3/uL (ref 150–400)
RBC: 4.63 MIL/uL (ref 3.87–5.11)
RDW: 13.4 % (ref 11.5–15.5)
WBC: 7.3 10*3/uL (ref 4.0–10.5)
nRBC: 0 % (ref 0.0–0.2)

## 2023-08-23 LAB — BASIC METABOLIC PANEL WITH GFR
Anion gap: 9 (ref 5–15)
BUN: 26 mg/dL — ABNORMAL HIGH (ref 8–23)
CO2: 27 mmol/L (ref 22–32)
Calcium: 8.9 mg/dL (ref 8.9–10.3)
Chloride: 104 mmol/L (ref 98–111)
Creatinine, Ser: 0.71 mg/dL (ref 0.44–1.00)
GFR, Estimated: >60 mL/min (ref >60)
Glucose, Bld: 92 mg/dL (ref 70–99)
Potassium: 4 mmol/L (ref 3.5–5.1)
Sodium: 140 mmol/L (ref 135–145)

## 2023-08-23 SURGERY — CYSTOSCOPY/URETEROSCOPY/HOLMIUM LASER/STENT PLACEMENT
Anesthesia: General | Laterality: Left

## 2023-08-23 MED ORDER — ACETAMINOPHEN 10 MG/ML IV SOLN: 1000.0000 mg | Freq: Once | INTRAVENOUS | Status: DC | PRN

## 2023-08-23 MED ORDER — OXYCODONE HCL 5 MG/5ML PO SOLN: 5.0000 mg | Freq: Once | ORAL | Status: DC | PRN

## 2023-08-23 MED ORDER — PHENYLEPHRINE 80 MCG/ML (10ML) SYRINGE FOR IV PUSH (FOR BLOOD PRESSURE SUPPORT)
PREFILLED_SYRINGE | INTRAVENOUS | Status: AC
  Filled 2023-08-23: qty 10

## 2023-08-23 MED ORDER — LACTATED RINGERS IV SOLN: INTRAVENOUS | Status: DC

## 2023-08-23 MED ORDER — PROPOFOL 10 MG/ML IV BOLUS
INTRAVENOUS | Status: AC
  Filled 2023-08-23: qty 20

## 2023-08-23 MED ORDER — CIPROFLOXACIN IN D5W 400 MG/200ML IV SOLN
400.0000 mg | INTRAVENOUS | Status: AC
  Administered 2023-08-23: 400 mg via INTRAVENOUS
  Filled 2023-08-23: qty 200

## 2023-08-23 MED ORDER — ONDANSETRON HCL 4 MG/2ML IJ SOLN
INTRAMUSCULAR | Status: DC | PRN
Start: 2023-08-23 — End: 2023-08-23
  Administered 2023-08-23: 4 mg via INTRAVENOUS

## 2023-08-23 MED ORDER — PROPOFOL 10 MG/ML IV BOLUS
INTRAVENOUS | Status: DC | PRN
  Administered 2023-08-23: 100 mg via INTRAVENOUS

## 2023-08-23 MED ORDER — PHENYLEPHRINE 80 MCG/ML (10ML) SYRINGE FOR IV PUSH (FOR BLOOD PRESSURE SUPPORT)
PREFILLED_SYRINGE | INTRAVENOUS | Status: DC | PRN
  Administered 2023-08-23 (×3): 160 ug via INTRAVENOUS

## 2023-08-23 MED ORDER — TRAMADOL HCL 50 MG PO TABS: 50.0000 mg | ORAL_TABLET | Freq: Four times a day (QID) | ORAL | 0 refills | Status: AC | PRN

## 2023-08-23 MED ORDER — LIDOCAINE HCL (PF) 2 % IJ SOLN
INTRAMUSCULAR | Status: AC
  Filled 2023-08-23: qty 5

## 2023-08-23 MED ORDER — ONDANSETRON HCL 4 MG/2ML IJ SOLN
INTRAMUSCULAR | Status: AC
  Filled 2023-08-23: qty 2

## 2023-08-23 MED ORDER — PHENYLEPHRINE HCL-NACL 20-0.9 MG/250ML-% IV SOLN
INTRAVENOUS | Status: DC | PRN
Start: 2023-08-23 — End: 2023-08-23
  Administered 2023-08-23: 20 ug/min via INTRAVENOUS

## 2023-08-23 MED ORDER — PHENYLEPHRINE HCL-NACL 20-0.9 MG/250ML-% IV SOLN
INTRAVENOUS | Status: AC
  Filled 2023-08-23: qty 250

## 2023-08-23 MED ORDER — FENTANYL CITRATE (PF) 100 MCG/2ML IJ SOLN
INTRAMUSCULAR | Status: AC
  Filled 2023-08-23: qty 2

## 2023-08-23 MED ORDER — FENTANYL CITRATE PF 50 MCG/ML IJ SOSY: 25.0000 ug | PREFILLED_SYRINGE | INTRAMUSCULAR | Status: DC | PRN

## 2023-08-23 MED ORDER — CHLORHEXIDINE GLUCONATE 0.12 % MT SOLN
15.0000 mL | Freq: Once | OROMUCOSAL | Status: AC
  Administered 2023-08-23: 15 mL via OROMUCOSAL

## 2023-08-23 MED ORDER — SODIUM CHLORIDE 0.9 % IR SOLN
Status: DC | PRN
  Administered 2023-08-23: 3000 mL via INTRAVESICAL

## 2023-08-23 MED ORDER — ORAL CARE MOUTH RINSE: 15.0000 mL | Freq: Once | OROMUCOSAL | Status: AC

## 2023-08-23 MED ORDER — LIDOCAINE HCL (PF) 2 % IJ SOLN
INTRAMUSCULAR | Status: DC | PRN
  Administered 2023-08-23: 100 mg via INTRADERMAL

## 2023-08-23 MED ORDER — DEXAMETHASONE SODIUM PHOSPHATE 10 MG/ML IJ SOLN
INTRAMUSCULAR | Status: DC | PRN
Start: 2023-08-23 — End: 2023-08-23
  Administered 2023-08-23: 8 mg via INTRAVENOUS

## 2023-08-23 MED ORDER — OXYCODONE HCL 5 MG PO TABS: 5.0000 mg | ORAL_TABLET | Freq: Once | ORAL | Status: DC | PRN

## 2023-08-23 MED ORDER — HYOSCYAMINE SULFATE 0.125 MG PO TBDP: 0.1250 mg | ORAL_TABLET | Freq: Four times a day (QID) | ORAL | 0 refills | Status: AC | PRN

## 2023-08-23 MED ORDER — DROPERIDOL 2.5 MG/ML IJ SOLN: 0.6250 mg | Freq: Once | INTRAMUSCULAR | Status: DC | PRN

## 2023-08-23 MED ORDER — DEXAMETHASONE SODIUM PHOSPHATE 10 MG/ML IJ SOLN
INTRAMUSCULAR | Status: AC
  Filled 2023-08-23: qty 1

## 2023-08-23 MED ORDER — FENTANYL CITRATE (PF) 100 MCG/2ML IJ SOLN
INTRAMUSCULAR | Status: DC | PRN
  Administered 2023-08-23 (×2): 50 ug via INTRAVENOUS

## 2023-08-23 SURGICAL SUPPLY — 22 items
BAG URO CATCHER STRL LF (MISCELLANEOUS) ×1 IMPLANT
BASKET ZERO TIP NITINOL 2.4FR (BASKET) IMPLANT
CATH URETL OPEN 5X70 (CATHETERS) ×1 IMPLANT
CATH URETL OPEN END 6FR 70 (CATHETERS) ×1 IMPLANT
CLOTH BEACON ORANGE TIMEOUT ST (SAFETY) ×1 IMPLANT
DRSG TEGADERM 2-3/8X2-3/4 SM (GAUZE/BANDAGES/DRESSINGS) IMPLANT
EXTRACTOR STONE 1.7FRX115CM (UROLOGICAL SUPPLIES) IMPLANT
FIBER LASER MOSES 200 DFL (Laser) IMPLANT
FIBER LASER MOSES 365 DFL (Laser) IMPLANT
GLOVE BIO SURGEON STRL SZ 6.5 (GLOVE) ×1 IMPLANT
GOWN STRL REUS W/ TWL LRG LVL3 (GOWN DISPOSABLE) ×1 IMPLANT
GUIDEWIRE STR DUAL SENSOR (WIRE) ×1 IMPLANT
KIT TURNOVER KIT A (KITS) IMPLANT
LASER FIB FLEXIVA PULSE ID 365 (Laser) IMPLANT
MANIFOLD NEPTUNE II (INSTRUMENTS) ×1 IMPLANT
PACK CYSTO (CUSTOM PROCEDURE TRAY) ×1 IMPLANT
SHEATH NAVIGATOR HD 11/13X28 (SHEATH) IMPLANT
SHEATH NAVIGATOR HD 11/13X36 (SHEATH) IMPLANT
STENT URET 6FRX24 CONTOUR (STENTS) IMPLANT
TRACTIP FLEXIVA PULS ID 200XHI (Laser) IMPLANT
TUBING CONNECTING 10 (TUBING) ×1 IMPLANT
TUBING UROLOGY SET (TUBING) ×1 IMPLANT

## 2023-08-23 NOTE — Op Note (Signed)
 Preoperative diagnosis: left ureteral calculus  Postoperative diagnosis: left ureteral calculus  Procedure:  Cystoscopy left ureteroscopy, laser lithotripsy, basket stone extraction left 57F x 24cm ureteral stent placement    Surgeon: Kasandra Knudsen, MD  Anesthesia: General  Complications: None  Intraoperative findings:  Normal urethra Bilateral orthotropic ureteral orifices Bladder mucosa normal without masses   EBL: Minimal  Specimens: left ureteral calculus  Disposition of specimens: [facility] for stone analysis  Indication: Carol Sanchez is a 73 y.o.  woman with 12 mm left renal pelvis calculus s/p ESWL with steinstrasse in left ureter following the procedure. patient with a  left ureteral stone and associated left symptoms. After reviewing the management options for treatment, the patient elected to proceed with the above surgical procedure(s). We have discussed the potential benefits and risks of the procedure, side effects of the proposed treatment, the likelihood of the patient achieving the goals of the procedure, and any potential problems that might occur during the procedure or recuperation. Informed consent has been obtained.   Description of procedure:  The patient was taken to the operating room and general anesthesia was induced.  The patient was placed in the dorsal lithotomy position, prepped and draped in the usual sterile fashion, and preoperative antibiotics were administered. A preoperative time-out was performed.   Cystourethroscopy was performed.  The patient's urethra was examined and was normal.  The bladder was then systematically examined in its entirety. There was no evidence for any bladder tumors, stones, or other mucosal pathology.    Attention turned to the left ureteral orifice and a 0.38 sensor wire was advanced up the left ureter into the renal pelvis under fluoroscopic guidance. The 4.5 Fr semirigid ureteroscope was  then advanced into the ureter next to the guidewire and several large stone fragments were seen stacked in the distal left ureter. The stones were then fragmented further with the 242 micron holmium laser fiber.  Some of the stone fragments were then removed from the ureter with a 0 tip basket.  There were so many fragments that the basket then became lodged in the distal ureter.  The basket was left in place and disconnected so that it could be removed from the ureteroscope taking care to keep in place in the ureter.  The semirigid ureteroscope was then navigated alongside the basket and a 365 holmium laser fiber was used to continue to break the stones up into tiny or fragments as well as laser of the basket open so that could be removed.  Lithotripsy continued until the stone fragments were small enough to then be removed with a second 0 tip basket.  Reinspection of the ureter revealed no remaining visible stones or fragments.  The patient had significant inflammation sloughing mucosa where the stones had been stuck.  The wire was then backloaded through the cystoscope and a ureteral stent was advance over the wire using Seldinger technique.  The stent was positioned appropriately under fluoroscopic and cystoscopic guidance.  The wire was then removed with an adequate stent curl noted in the renal pelvis as well as in the bladder.  The bladder was then emptied and the procedure ended.  The patient appeared to tolerate the procedure well and without complications.  The patient was able to be awakened and transferred to the recovery unit in satisfactory condition.   Disposition: No tether was left on the stent.  I would like this sent to standpoints for approximately 2 weeks to allow for healing of the  ureter.

## 2023-08-23 NOTE — Anesthesia Postprocedure Evaluation (Signed)
 Anesthesia Post Note  Patient: Carol Sanchez  Procedure(s) Performed: CYSTOSCOPY/URETEROSCOPY/HOLMIUM LASER/STENT PLACEMENT (Left)     Patient location during evaluation: PACU Anesthesia Type: General Level of consciousness: awake and alert Pain management: pain level controlled Vital Signs Assessment: post-procedure vital signs reviewed and stable Respiratory status: spontaneous breathing, nonlabored ventilation, respiratory function stable and patient connected to nasal cannula oxygen Cardiovascular status: blood pressure returned to baseline and stable Postop Assessment: no apparent nausea or vomiting Anesthetic complications: no   No notable events documented.  Last Vitals:  Vitals:   08/23/23 1410 08/23/23 1415  BP: (!) 143/73 (!) 145/73  Pulse: 74 70  Resp: 12   Temp: 36.4 C   SpO2: 93% 93%    Last Pain:  Vitals:   08/23/23 1410  TempSrc:   PainSc: 0-No pain                 [place]

## 2023-08-23 NOTE — Anesthesia Procedure Notes (Signed)
 Procedure Name: LMA Insertion Date/Time: 08/23/2023 11:37 AM  Performed by: Doran Clay, CRNAPre-anesthesia Checklist: Patient identified, Emergency Drugs available, Suction available, Patient being monitored and Timeout performed Patient Re-evaluated:Patient Re-evaluated prior to induction Oxygen Delivery Method: Circle system utilized Preoxygenation: Pre-oxygenation with 100% oxygen Induction Type: IV induction LMA: LMA inserted LMA Size: 4.0 Tube type: Oral Number of attempts: 1 Placement Confirmation: positive ETCO2 and breath sounds checked- equal and bilateral Tube secured with: Tape Dental Injury: Teeth and Oropharynx as per pre-operative assessment

## 2023-08-23 NOTE — Interval H&P Note (Signed)
 History and Physical Interval Note:  08/23/2023 11:12 AM  Ann Held  has presented today for surgery, with the diagnosis of LEFT STEINSTRAUSS.  The various methods of treatment have been discussed with the patient and family. After consideration of risks, benefits and other options for treatment, the patient has consented to  Procedure(s) with comments: CYSTOSCOPY/URETEROSCOPY/HOLMIUM LASER/STENT PLACEMENT (Left) - CYSTOSCOPY/LEFT URETEROSCOPY/HOLMIUM LASER LITHOTRIPSY/STENT PLACEMENT/RETROGRADE PYELOGRAM CYSTOSCOPY, WITH RETROGRADE PYELOGRAM (Left) as a surgical intervention.  The patient's history has been reviewed, patient examined, no change in status, stable for surgery.  I have reviewed the patient's chart and labs.  Questions were answered to the patient's satisfaction.     Angeline Trick D Elaijah Munoz

## 2023-08-23 NOTE — Discharge Instructions (Addendum)
 DISCHARGE INSTRUCTIONS FOR KIDNEY STONE/URETERAL STENT   MEDICATIONS:  1. Resume all your other meds from home  2. AZO over the counter can help with the burning/stinging when you urinate. 3. Tramadol is for moderate/severe pain, otherwise taking up to 1000 mg every 6 hours of plainTylenol will help treat your pain.   4. Tamsulosin and hyoscyamine can help with stent discomfort   ACTIVITY:  1. No strenuous activity x 1week  2. No driving while on narcotic pain medications  3. Drink plenty of water  4. Continue to walk at home - you can still get blood clots when you are at home, so keep active, but don't over do it.  5. May return to work/school tomorrow or when you feel ready   BATHING:  1. You can shower and we recommend daily showers    SIGNS/SYMPTOMS TO CALL:  Please call us if you have a fever greater than 101.5, uncontrolled nausea/vomiting, uncontrolled pain, dizziness, unable to urinate, bloody urine, chest pain, shortness of breath, leg swelling, leg pain, redness around wound, drainage from wound, or any other concerns or questions.   You can reach [place] at [phone].   FOLLOW-UP:  1. You will be contacted with a date to remove your stent in the office in 10-14 days. 2.  Please bring your stone fragments to the office so that we can analyze them.

## 2023-08-23 NOTE — Transfer of Care (Signed)
 Immediate Anesthesia Transfer of Care Note  Patient: Carol Sanchez  Procedure(s) Performed: CYSTOSCOPY/URETEROSCOPY/HOLMIUM LASER/STENT PLACEMENT (Left)  Patient Location: PACU  Anesthesia Type:General  Level of Consciousness: sedated  Airway & Oxygen Therapy: Patient Spontanous Breathing and Patient connected to face mask oxygen  Post-op Assessment: Report given to RN and Post -op Vital signs reviewed and stable  Post vital signs: Reviewed and stable  Last Vitals:  Vitals Value Taken Time  BP    Temp    Pulse    Resp    SpO2      Last Pain:  Vitals:   08/23/23 1026  TempSrc:   PainSc: 0-No pain         Complications: No notable events documented.

## 2023-08-24 ENCOUNTER — Encounter (HOSPITAL_COMMUNITY): Payer: Self-pay | Admitting: Urology

## 2023-09-03 DIAGNOSIS — E039 Hypothyroidism, unspecified: Secondary | ICD-10-CM | POA: Diagnosis not present

## 2023-09-03 DIAGNOSIS — E7800 Pure hypercholesterolemia, unspecified: Secondary | ICD-10-CM | POA: Diagnosis not present

## 2023-09-03 DIAGNOSIS — Z6836 Body mass index (BMI) 36.0-36.9, adult: Secondary | ICD-10-CM | POA: Diagnosis not present

## 2023-09-06 DIAGNOSIS — N201 Calculus of ureter: Secondary | ICD-10-CM | POA: Diagnosis not present

## 2023-09-07 DIAGNOSIS — E569 Vitamin deficiency, unspecified: Secondary | ICD-10-CM | POA: Diagnosis not present

## 2023-09-07 DIAGNOSIS — Z9884 Bariatric surgery status: Secondary | ICD-10-CM | POA: Diagnosis not present

## 2023-09-10 ENCOUNTER — Encounter (INDEPENDENT_AMBULATORY_CARE_PROVIDER_SITE_OTHER): Payer: Self-pay

## 2023-09-11 DIAGNOSIS — M25561 Pain in right knee: Secondary | ICD-10-CM | POA: Diagnosis not present

## 2023-09-11 DIAGNOSIS — M6281 Muscle weakness (generalized): Secondary | ICD-10-CM | POA: Diagnosis not present

## 2023-09-13 DIAGNOSIS — M6281 Muscle weakness (generalized): Secondary | ICD-10-CM | POA: Diagnosis not present

## 2023-09-13 DIAGNOSIS — M25561 Pain in right knee: Secondary | ICD-10-CM | POA: Diagnosis not present

## 2023-09-17 DIAGNOSIS — N200 Calculus of kidney: Secondary | ICD-10-CM | POA: Diagnosis not present

## 2023-09-18 ENCOUNTER — Encounter: Payer: Self-pay | Admitting: Nurse Practitioner

## 2023-09-18 ENCOUNTER — Ambulatory Visit (INDEPENDENT_AMBULATORY_CARE_PROVIDER_SITE_OTHER): Admitting: Nurse Practitioner

## 2023-09-18 VITALS — BP 127/81 | HR 69 | Temp 98.0°F | Ht 60.0 in | Wt 189.0 lb

## 2023-09-18 DIAGNOSIS — Z6836 Body mass index (BMI) 36.0-36.9, adult: Secondary | ICD-10-CM | POA: Diagnosis not present

## 2023-09-18 DIAGNOSIS — I10 Essential (primary) hypertension: Secondary | ICD-10-CM

## 2023-09-18 DIAGNOSIS — E785 Hyperlipidemia, unspecified: Secondary | ICD-10-CM | POA: Diagnosis not present

## 2023-09-18 DIAGNOSIS — E66812 Obesity, class 2: Secondary | ICD-10-CM | POA: Diagnosis not present

## 2023-09-18 DIAGNOSIS — Z9884 Bariatric surgery status: Secondary | ICD-10-CM

## 2023-09-18 DIAGNOSIS — D509 Iron deficiency anemia, unspecified: Secondary | ICD-10-CM

## 2023-09-18 DIAGNOSIS — M25561 Pain in right knee: Secondary | ICD-10-CM | POA: Diagnosis not present

## 2023-09-18 DIAGNOSIS — E039 Hypothyroidism, unspecified: Secondary | ICD-10-CM | POA: Diagnosis not present

## 2023-09-18 DIAGNOSIS — M6281 Muscle weakness (generalized): Secondary | ICD-10-CM | POA: Diagnosis not present

## 2023-09-18 DIAGNOSIS — G5600 Carpal tunnel syndrome, unspecified upper limb: Secondary | ICD-10-CM | POA: Insufficient documentation

## 2023-09-18 DIAGNOSIS — R7303 Prediabetes: Secondary | ICD-10-CM

## 2023-09-18 NOTE — Progress Notes (Signed)
 Office: [phone]  /  Fax: [phone]   Initial Visit  Carol Sanchez was seen in clinic today to evaluate for obesity. She is interested in losing weight to improve overall health and reduce the risk of weight related complications. She presents today to review program treatment options, initial physical assessment, and evaluation.     She was referred by: Specialist  She is a retired NP  When asked what else they would like to accomplish? She states: Adopt healthier eating patterns, Improve quality of life, Improve appearance, and Improve self-confidence  Bariatric surgery:  She had RNY in 2019 with Dr. Gaylyn Keas.  Her highest prior to surgery was 264lbs and her nadir weight after surgery is her current weight.   She has been going to [facility] and has been taking compounding semaglutide.  She stopped in Jan due to having surgery.  She had kidney stones in Feb.  She is taking Vit D 5,000 international units , bariatric MVI with iron, calcium TID, not currently on Vit D. Notes restriction.  Notes she vomits based upon what she eats-sugar, ice cream.    When asked how has your weight affected you? She states: Contributed to orthopedic problems or mobility issues, Having fatigue, and Having poor endurance  Some associated conditions: anemia, aortic valve stenosis, HTN, benign paroxysmal positional vertigo, CTS, CKD stage 2, HLD, history of thyroidectomy, Hypothyroidism, metabolic syndrome, back pain, hip pain, knee pain, pre diabetes  Contributing factors: Family history of obesity, Moderate to high levels of stress, and Frequent travel  Weight promoting medications identified: None  Current nutrition plan: None  Current level of physical activity: going to PT twice per week-walks her dog daily.  Current or previous pharmacotherapy: Phentermine, Belviq,   Response to medication: Lost weight and was able to maintain weight loss   Past medical history includes:   Past Medical  History:  Diagnosis Date   Anemia    in the past, r/o hiatal hernia   Aortic calcification (HCC)    Cancer (HCC) 2003   thyroid    Carotid artery disease (HCC)    Complication of anesthesia    very sensitive to anesthesia   GERD (gastroesophageal reflux disease)    before gatric bypass   History of kidney stones    Hypertension    Pre-diabetes    Metobolic Syndrome     Objective:   BP 127/81   Pulse 69   Temp 98 F (36.7 C)   Ht 5' (1.524 m)   Wt 189 lb (85.7 kg)   SpO2 98%   BMI 36.91 kg/m  She was weighed on the bioimpedance scale: Body mass index is 36.91 kg/m.  Peak Weight:264 lbs , Body Fat%:48.5%, Visceral Fat Rating:16, Weight trend over the last 12 months: Decreasing  General:  Alert, oriented and cooperative. Patient is in no acute distress.  Respiratory: Normal respiratory effort, no problems with respiration noted   Gait: able to ambulate independently  Mental Status: Normal mood and affect. Normal behavior. Normal judgment and thought content.   DIAGNOSTIC DATA REVIEWED:  BMET    Component Value Date/Time   NA 140 08/23/2023 1030   NA 139 05/25/2022 0923   K 4.0 08/23/2023 1030   CL 104 08/23/2023 1030   CO2 27 08/23/2023 1030   GLUCOSE 92 08/23/2023 1030   BUN 26 (H) 08/23/2023 1030   BUN 22 05/25/2022 0923   CREATININE 0.71 08/23/2023 1030   CALCIUM 8.9 08/23/2023 1030   GFRNONAA >60 08/23/2023 1030  GFRAA >60 01/22/2018 1801   Lab Results  Component Value Date   HGBA1C 5.9 (H) 01/15/2018   No results found for: "INSULIN " CBC    Component Value Date/Time   WBC 7.3 08/23/2023 1030   RBC 4.63 08/23/2023 1030   HGB 13.6 08/23/2023 1030   HCT 42.6 08/23/2023 1030   PLT 299 08/23/2023 1030   MCV 92.0 08/23/2023 1030   MCH 29.4 08/23/2023 1030   MCHC 31.9 08/23/2023 1030   RDW 13.4 08/23/2023 1030   Iron/TIBC/Ferritin/ %Sat No results found for: "IRON", "TIBC", "FERRITIN", "IRONPCTSAT" Lipid Panel  No results found for: "CHOL",  "TRIG", "HDL", "CHOLHDL", "VLDL", "LDLCALC", "LDLDIRECT" Hepatic Function Panel     Component Value Date/Time   PROT 6.9 01/15/2018 0913   ALBUMIN 3.9 01/15/2018 0913   AST 19 01/15/2018 0913   ALT 22 01/15/2018 0913   ALKPHOS 67 01/15/2018 0913   BILITOT 0.7 01/15/2018 0913   No results found for: "TSH"   Assessment and Plan:   Hyperlipidemia, unspecified hyperlipidemia type Will obtain fasting labs at next visit. Continue follow-up with PCP.  Continue medications as directed.  Hypothyroidism, unspecified type Continue to follow-up with PCP.  Continue medications as directed  Iron deficiency anemia, unspecified iron deficiency anemia type We will obtain labs at next visit.  Pre-diabetes Will obtain labs at next visit.  Essential hypertension Continue follow-up with PCP.  Continue medications as directed.  History of bariatric surgery Will obtain labs at next visit.  Class 2 obesity due to excess calories with body mass index (BMI) of 36.0 to 36.9 in adult, unspecified whether serious comorbidity present        Obesity Treatment / Action Plan:  Patient will work on garnering support from family and friends to begin weight loss journey. Will work on eliminating or reducing the presence of highly palatable, calorie dense foods in the home. Will complete provided nutritional and psychosocial assessment questionnaire before the next appointment. Will be scheduled for indirect calorimetry to determine resting energy expenditure in a fasting state.  This will allow us  to create a reduced calorie, high-protein meal plan to promote loss of fat mass while preserving muscle mass. Counseled on the health benefits of losing 5%-15% of total body weight. Was counseled on nutritional approaches to weight loss and benefits of reducing processed foods and consuming plant-based foods and high quality protein as part of nutritional weight management. Was counseled on pharmacotherapy and  role as an adjunct in weight management.   Obesity Education Performed Today:  She was weighed on the bioimpedance scale and results were discussed and documented in the synopsis.  We discussed obesity as a disease and the importance of a more detailed evaluation of all the factors contributing to the disease.  We discussed the importance of long term lifestyle changes which include nutrition, exercise and behavioral modifications as well as the importance of customizing this to her specific health and social needs.  We discussed the benefits of reaching a healthier weight to alleviate the symptoms of existing conditions and reduce the risks of the biomechanical, metabolic and psychological effects of obesity.  Jamyla E Mendonca appears to be in the action stage of change and states they are ready to start intensive lifestyle modifications and behavioral modifications.  30 minutes was spent today on this visit including the above counseling, pre-visit chart review, and post-visit documentation.  Reviewed by clinician on day of visit: allergies, medications, problem list, medical history, surgical history, family history, social history, and  previous encounter notes pertinent to obesity diagnosis.    Crist Dominion Josemanuel Eakins FNP-C

## 2023-09-20 DIAGNOSIS — M25561 Pain in right knee: Secondary | ICD-10-CM | POA: Diagnosis not present

## 2023-09-20 DIAGNOSIS — M6281 Muscle weakness (generalized): Secondary | ICD-10-CM | POA: Diagnosis not present

## 2023-10-12 DIAGNOSIS — N202 Calculus of kidney with calculus of ureter: Secondary | ICD-10-CM | POA: Diagnosis not present

## 2023-10-15 DIAGNOSIS — R7303 Prediabetes: Secondary | ICD-10-CM | POA: Diagnosis not present

## 2023-10-15 DIAGNOSIS — M8588 Other specified disorders of bone density and structure, other site: Secondary | ICD-10-CM | POA: Diagnosis not present

## 2023-10-15 DIAGNOSIS — Z1272 Encounter for screening for malignant neoplasm of vagina: Secondary | ICD-10-CM | POA: Diagnosis not present

## 2023-10-15 DIAGNOSIS — Z124 Encounter for screening for malignant neoplasm of cervix: Secondary | ICD-10-CM | POA: Diagnosis not present

## 2023-10-15 DIAGNOSIS — E039 Hypothyroidism, unspecified: Secondary | ICD-10-CM | POA: Diagnosis not present

## 2023-10-15 DIAGNOSIS — N958 Other specified menopausal and perimenopausal disorders: Secondary | ICD-10-CM | POA: Diagnosis not present

## 2023-10-15 DIAGNOSIS — Z6836 Body mass index (BMI) 36.0-36.9, adult: Secondary | ICD-10-CM | POA: Diagnosis not present

## 2023-10-15 DIAGNOSIS — Z6838 Body mass index (BMI) 38.0-38.9, adult: Secondary | ICD-10-CM | POA: Diagnosis not present

## 2023-10-16 DIAGNOSIS — M6281 Muscle weakness (generalized): Secondary | ICD-10-CM | POA: Diagnosis not present

## 2023-10-16 DIAGNOSIS — M25561 Pain in right knee: Secondary | ICD-10-CM | POA: Diagnosis not present

## 2023-10-18 DIAGNOSIS — Z5189 Encounter for other specified aftercare: Secondary | ICD-10-CM | POA: Diagnosis not present

## 2023-10-19 DIAGNOSIS — M25561 Pain in right knee: Secondary | ICD-10-CM | POA: Diagnosis not present

## 2023-10-19 DIAGNOSIS — M6281 Muscle weakness (generalized): Secondary | ICD-10-CM | POA: Diagnosis not present

## 2023-10-23 DIAGNOSIS — M25561 Pain in right knee: Secondary | ICD-10-CM | POA: Diagnosis not present

## 2023-10-23 DIAGNOSIS — M6281 Muscle weakness (generalized): Secondary | ICD-10-CM | POA: Diagnosis not present

## 2023-10-25 DIAGNOSIS — M6281 Muscle weakness (generalized): Secondary | ICD-10-CM | POA: Diagnosis not present

## 2023-10-25 DIAGNOSIS — M25561 Pain in right knee: Secondary | ICD-10-CM | POA: Diagnosis not present

## 2023-10-30 DIAGNOSIS — M6281 Muscle weakness (generalized): Secondary | ICD-10-CM | POA: Diagnosis not present

## 2023-10-30 DIAGNOSIS — M25551 Pain in right hip: Secondary | ICD-10-CM | POA: Diagnosis not present

## 2023-11-02 DIAGNOSIS — M6281 Muscle weakness (generalized): Secondary | ICD-10-CM | POA: Diagnosis not present

## 2023-11-02 DIAGNOSIS — M25551 Pain in right hip: Secondary | ICD-10-CM | POA: Diagnosis not present

## 2023-11-05 DIAGNOSIS — Z6836 Body mass index (BMI) 36.0-36.9, adult: Secondary | ICD-10-CM | POA: Diagnosis not present

## 2023-11-05 DIAGNOSIS — I10 Essential (primary) hypertension: Secondary | ICD-10-CM | POA: Diagnosis not present

## 2023-11-06 DIAGNOSIS — M25551 Pain in right hip: Secondary | ICD-10-CM | POA: Diagnosis not present

## 2023-11-06 DIAGNOSIS — M6281 Muscle weakness (generalized): Secondary | ICD-10-CM | POA: Diagnosis not present

## 2023-11-06 DIAGNOSIS — M25561 Pain in right knee: Secondary | ICD-10-CM | POA: Diagnosis not present

## 2023-11-15 DIAGNOSIS — M25561 Pain in right knee: Secondary | ICD-10-CM | POA: Diagnosis not present

## 2023-11-15 DIAGNOSIS — M6281 Muscle weakness (generalized): Secondary | ICD-10-CM | POA: Diagnosis not present

## 2023-11-15 DIAGNOSIS — M25551 Pain in right hip: Secondary | ICD-10-CM | POA: Diagnosis not present

## 2023-11-27 DIAGNOSIS — M25551 Pain in right hip: Secondary | ICD-10-CM | POA: Diagnosis not present

## 2023-11-27 DIAGNOSIS — M6281 Muscle weakness (generalized): Secondary | ICD-10-CM | POA: Diagnosis not present

## 2023-12-11 DIAGNOSIS — M25551 Pain in right hip: Secondary | ICD-10-CM | POA: Diagnosis not present

## 2023-12-11 DIAGNOSIS — M6281 Muscle weakness (generalized): Secondary | ICD-10-CM | POA: Diagnosis not present

## 2023-12-25 DIAGNOSIS — M25551 Pain in right hip: Secondary | ICD-10-CM | POA: Diagnosis not present

## 2023-12-25 DIAGNOSIS — M6281 Muscle weakness (generalized): Secondary | ICD-10-CM | POA: Diagnosis not present

## 2024-01-10 DIAGNOSIS — M1711 Unilateral primary osteoarthritis, right knee: Secondary | ICD-10-CM | POA: Diagnosis not present

## 2024-01-10 DIAGNOSIS — Z5189 Encounter for other specified aftercare: Secondary | ICD-10-CM | POA: Diagnosis not present

## 2024-01-17 DIAGNOSIS — H2513 Age-related nuclear cataract, bilateral: Secondary | ICD-10-CM | POA: Diagnosis not present

## 2024-01-17 DIAGNOSIS — H53001 Unspecified amblyopia, right eye: Secondary | ICD-10-CM | POA: Diagnosis not present

## 2024-01-17 DIAGNOSIS — H2589 Other age-related cataract: Secondary | ICD-10-CM | POA: Diagnosis not present

## 2024-01-17 DIAGNOSIS — H43811 Vitreous degeneration, right eye: Secondary | ICD-10-CM | POA: Diagnosis not present

## 2024-03-21 ENCOUNTER — Other Ambulatory Visit: Payer: Self-pay | Admitting: Obstetrics and Gynecology

## 2024-03-21 DIAGNOSIS — Z1231 Encounter for screening mammogram for malignant neoplasm of breast: Secondary | ICD-10-CM

## 2024-03-25 ENCOUNTER — Ambulatory Visit
Admission: RE | Admit: 2024-03-25 | Discharge: 2024-03-25 | Disposition: A | Discharge status: Home / Self Care | Source: Ambulatory Visit | Attending: Obstetrics and Gynecology | Admitting: Obstetrics and Gynecology

## 2024-03-25 DIAGNOSIS — Z1231 Encounter for screening mammogram for malignant neoplasm of breast: Secondary | ICD-10-CM | POA: Diagnosis not present

## 2024-04-09 DIAGNOSIS — N200 Calculus of kidney: Secondary | ICD-10-CM | POA: Diagnosis not present

## 2024-04-11 DIAGNOSIS — H25811 Combined forms of age-related cataract, right eye: Secondary | ICD-10-CM | POA: Diagnosis not present

## 2024-04-11 DIAGNOSIS — H52221 Regular astigmatism, right eye: Secondary | ICD-10-CM | POA: Diagnosis not present

## 2024-04-11 DIAGNOSIS — H5371 Glare sensitivity: Secondary | ICD-10-CM | POA: Diagnosis not present

## 2024-04-11 DIAGNOSIS — H2511 Age-related nuclear cataract, right eye: Secondary | ICD-10-CM | POA: Diagnosis not present

## 2024-05-02 DIAGNOSIS — H52222 Regular astigmatism, left eye: Secondary | ICD-10-CM | POA: Diagnosis not present

## 2024-05-02 DIAGNOSIS — H25812 Combined forms of age-related cataract, left eye: Secondary | ICD-10-CM | POA: Diagnosis not present

## 2024-05-02 DIAGNOSIS — H5371 Glare sensitivity: Secondary | ICD-10-CM | POA: Diagnosis not present

## 2024-05-07 ENCOUNTER — Ambulatory Visit (HOSPITAL_COMMUNITY)
Admission: RE | Admit: 2024-05-07 | Discharge: 2024-05-07 | Disposition: A | Discharge status: Home / Self Care | Payer: PPO | Source: Ambulatory Visit | Attending: Cardiovascular Disease | Admitting: Cardiovascular Disease

## 2024-05-07 DIAGNOSIS — I350 Nonrheumatic aortic (valve) stenosis: Secondary | ICD-10-CM | POA: Insufficient documentation

## 2024-05-07 LAB — ECHOCARDIOGRAM COMPLETE
AR max vel: 0.9 cm2
AV Area VTI: 0.88 cm2
AV Area mean vel: 0.88 cm2
AV Mean grad: 25 mmHg
AV Peak grad: 39.9 mmHg
Ao pk vel: 3.16 m/s
Area-P 1/2: 2.87 cm2
MV M vel: 4.16 m/s
MV Peak grad: 69.2 mmHg
P 1/2 time: 455 ms
S' Lateral: 2.4 cm

## 2024-05-08 ENCOUNTER — Ambulatory Visit: Payer: Self-pay | Admitting: Cardiovascular Disease

## 2024-05-08 DIAGNOSIS — I350 Nonrheumatic aortic (valve) stenosis: Secondary | ICD-10-CM

## 2024-05-08 DIAGNOSIS — I340 Nonrheumatic mitral (valve) insufficiency: Secondary | ICD-10-CM

## 2024-06-30 ENCOUNTER — Ambulatory Visit: Admitting: Emergency Medicine

## 2024-07-11 ENCOUNTER — Ambulatory Visit: Admitting: Emergency Medicine
